# Patient Record
Sex: Female | Born: 1975 | Race: White | Hispanic: No | Smoking: Never smoker
Health system: Southern US, Community
[De-identification: ages and names within clinical notes are randomized; demographics above are authoritative.]

## PROBLEM LIST (undated history)

## (undated) DIAGNOSIS — F32A Depression, unspecified: Secondary | ICD-10-CM

## (undated) DIAGNOSIS — I728 Aneurysm of other specified arteries: Secondary | ICD-10-CM

## (undated) DIAGNOSIS — F329 Major depressive disorder, single episode, unspecified: Secondary | ICD-10-CM

## (undated) DIAGNOSIS — E079 Disorder of thyroid, unspecified: Secondary | ICD-10-CM

## (undated) DIAGNOSIS — I82409 Acute embolism and thrombosis of unspecified deep veins of unspecified lower extremity: Secondary | ICD-10-CM

## (undated) DIAGNOSIS — F419 Anxiety disorder, unspecified: Secondary | ICD-10-CM

## (undated) DIAGNOSIS — G43909 Migraine, unspecified, not intractable, without status migrainosus: Secondary | ICD-10-CM

## (undated) DIAGNOSIS — I2699 Other pulmonary embolism without acute cor pulmonale: Secondary | ICD-10-CM

## (undated) HISTORY — PX: TUBAL LIGATION: SHX77

## (undated) HISTORY — PX: TONSILLECTOMY: SUR1361

---

## 1998-05-23 ENCOUNTER — Other Ambulatory Visit: Admission: RE | Admit: 1998-05-23 | Discharge: 1998-05-23 | Payer: Self-pay | Admitting: Family Medicine

## 2007-06-07 ENCOUNTER — Encounter: Admission: RE | Admit: 2007-06-07 | Discharge: 2007-06-07 | Payer: Self-pay | Admitting: *Deleted

## 2009-03-12 ENCOUNTER — Encounter: Admission: RE | Admit: 2009-03-12 | Discharge: 2009-03-12 | Payer: Self-pay | Admitting: Family Medicine

## 2009-11-26 ENCOUNTER — Encounter: Admission: RE | Admit: 2009-11-26 | Discharge: 2009-11-26 | Payer: Self-pay | Admitting: Family Medicine

## 2013-06-07 ENCOUNTER — Encounter (HOSPITAL_BASED_OUTPATIENT_CLINIC_OR_DEPARTMENT_OTHER): Payer: Self-pay | Admitting: *Deleted

## 2013-06-07 ENCOUNTER — Emergency Department (HOSPITAL_BASED_OUTPATIENT_CLINIC_OR_DEPARTMENT_OTHER): Payer: 59

## 2013-06-07 ENCOUNTER — Emergency Department (HOSPITAL_BASED_OUTPATIENT_CLINIC_OR_DEPARTMENT_OTHER)
Admission: EM | Admit: 2013-06-07 | Discharge: 2013-06-08 | Disposition: A | Discharge status: Home / Self Care | Payer: 59 | Attending: Emergency Medicine | Admitting: Emergency Medicine

## 2013-06-07 DIAGNOSIS — M79605 Pain in left leg: Secondary | ICD-10-CM

## 2013-06-07 DIAGNOSIS — Z79899 Other long term (current) drug therapy: Secondary | ICD-10-CM | POA: Insufficient documentation

## 2013-06-07 DIAGNOSIS — M25569 Pain in unspecified knee: Secondary | ICD-10-CM | POA: Insufficient documentation

## 2013-06-07 DIAGNOSIS — Z86711 Personal history of pulmonary embolism: Secondary | ICD-10-CM | POA: Insufficient documentation

## 2013-06-07 DIAGNOSIS — E079 Disorder of thyroid, unspecified: Secondary | ICD-10-CM | POA: Insufficient documentation

## 2013-06-07 DIAGNOSIS — Z86718 Personal history of other venous thrombosis and embolism: Secondary | ICD-10-CM | POA: Insufficient documentation

## 2013-06-07 DIAGNOSIS — F3289 Other specified depressive episodes: Secondary | ICD-10-CM | POA: Insufficient documentation

## 2013-06-07 DIAGNOSIS — M25562 Pain in left knee: Secondary | ICD-10-CM

## 2013-06-07 DIAGNOSIS — F329 Major depressive disorder, single episode, unspecified: Secondary | ICD-10-CM | POA: Insufficient documentation

## 2013-06-07 HISTORY — DX: Acute embolism and thrombosis of unspecified deep veins of unspecified lower extremity: I82.409

## 2013-06-07 HISTORY — DX: Depression, unspecified: F32.A

## 2013-06-07 HISTORY — DX: Other pulmonary embolism without acute cor pulmonale: I26.99

## 2013-06-07 HISTORY — DX: Disorder of thyroid, unspecified: E07.9

## 2013-06-07 HISTORY — DX: Major depressive disorder, single episode, unspecified: F32.9

## 2013-06-07 LAB — CBC
HCT: 38.4 % (ref 36.0–46.0)
MCH: 30 pg (ref 26.0–34.0)
MCV: 88.1 fL (ref 78.0–100.0)
RBC: 4.36 MIL/uL (ref 3.87–5.11)
RDW: 12.8 % (ref 11.5–15.5)
WBC: 11.9 10*3/uL — ABNORMAL HIGH (ref 4.0–10.5)

## 2013-06-07 LAB — BASIC METABOLIC PANEL
Chloride: 105 mEq/L (ref 96–112)
GFR calc Af Amer: 84 mL/min — ABNORMAL LOW (ref >90)
Glucose, Bld: 120 mg/dL — ABNORMAL HIGH (ref 70–99)
Sodium: 142 mEq/L (ref 135–145)

## 2013-06-07 MED ORDER — MORPHINE SULFATE 4 MG/ML IJ SOLN
6.0000 mg | Freq: Once | INTRAMUSCULAR | Status: AC
  Administered 2013-06-07: 6 mg via INTRAVENOUS
  Filled 2013-06-07: qty 2

## 2013-06-07 MED ORDER — IOHEXOL 350 MG/ML SOLN
80.0000 mL | Freq: Once | INTRAVENOUS | Status: AC | PRN
  Administered 2013-06-07: 80 mL via INTRAVENOUS

## 2013-06-07 NOTE — ED Notes (Signed)
Vital signs stable. 

## 2013-06-07 NOTE — ED Provider Notes (Signed)
CSN: [id number]     Arrival date & time 06/07/13  2216 History     First MD Initiated Contact with Patient 06/07/13 2243     Chief Complaint  Patient presents with  . Leg Swelling    The history is provided by the patient.   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  She was on anticoagulation for a short time and is no longer on any anticoagulation.  At that time her DVT was blamed on the fact that she was on enzymes is estrogens.  She did not smoke cigarettes at this time.  Her grandmother has a history of blood clots as well.  There is no other family members with venothromboembolic disease.  No fevers or chills.  She reports there is a significant amount of pain in her left foot and that her left foot and swelling as well as she noticed first.  She denies recent fall or trauma to her left foot.  She denies numbness or weakness of her left foot.  No rash or redness noted.  No fevers or chills  Past Medical History  Diagnosis Date  . DVT (deep venous thrombosis)   . PE (pulmonary embolism)   . Thyroid disease   . Depression    Past Surgical History  Procedure Laterality Date  . Tubal ligation    . Tonsillectomy     History reviewed. No pertinent family history. History  Substance Use Topics  . Smoking status: Never Smoker   . Smokeless tobacco: Not on file  . Alcohol Use: No   OB History   Grav Para Term Preterm Abortions TAB SAB Ect Mult Living                 Review of Systems  All other systems reviewed and are negative.    Allergies  Sulfa antibiotics and Wellbutrin  Home Medications   Current Outpatient Rx  Name  Route  Sig  Dispense  Refill  . ALPRAZolam (XANAX) 0.25 MG tablet   Oral   Take 0.25 mg by mouth at bedtime as needed for sleep.         . cholecalciferol (VITAMIN D) 1000 UNITS tablet   Oral   Take 1,000 Units by mouth daily.         Marland Kitchen levothyroxine (SYNTHROID, LEVOTHROID) 200 MCG tablet   Oral   Take 200 mcg  by mouth daily before breakfast.         . ranitidine (ZANTAC) 150 MG tablet   Oral   Take 150 mg by mouth 2 (two) times daily.         . sertraline (ZOLOFT) 100 MG tablet   Oral   Take 100 mg by mouth daily.          BP 167/108  Pulse 89  Temp(Src) 98.8 F (37.1 C) (Oral)  Resp 18  Wt 280 lb (127.007 kg)  SpO2 100% Physical Exam  Nursing note and vitals reviewed. Constitutional: She is oriented to person, place, and time. She appears well-developed and well-nourished. No distress.  HENT:  Head: Normocephalic and atraumatic.  Eyes: EOM are normal.  Neck: Normal range of motion.  Cardiovascular: Normal rate, regular rhythm and normal heart sounds.   Pulmonary/Chest: Effort normal and breath sounds normal.  Abdominal: Soft. She exhibits no distension. There is no tenderness.  Musculoskeletal: Normal range of motion.  Swelling of her left foot with tenderness over the second and third as  well as fourth metatarsal of the dorsal surface of left foot.  Normal pulses in left foot.  Patient with tenderness of her left calf.  Mild tenderness over left medial thigh as well.  Neurological: She is alert and oriented to person, place, and time.  Skin: Skin is warm and dry.  Psychiatric: She has a normal mood and affect. Judgment normal.    ED Course   Procedures (including critical care time)  Labs Reviewed  CBC - Abnormal; Notable for the following:    WBC 11.9 (*)    All other components within normal limits  BASIC METABOLIC PANEL - Abnormal; Notable for the following:    Glucose, Bld 120 (*)    GFR calc non Af Amer 72 (*)    GFR calc Af Amer 84 (*)    All other components within normal limits    1. Pain in joint, lower leg, left   2. Leg pain, left     MDM  History DVT is important that the patient be evaluated for pulmonary embolism given her chest pain shortness of breath.  She's no longer on anticoagulants.  CT scan pending at this time.  Left foot does seem to  be tender but there is no trauma to this area.  X-rays will be obtained.  Patient will likely require outpatient imaging in the form of the left lower extremity duplex ultrasound to evaluate for DVT.  Brief bedside ultrasound that demonstrated compressibility of her proximal left deep venous system.  This is not an official study.  Her CT scan is negative she'll likely need be dose with Lovenox will need to be discharged home with instructions to followup tomorrow for duplex ultrasound of her left lower extremity at this facility.  Overall the patient looks okay.  Her vital signs are stable.  She has not tachycardic or hypoxic.  Lyanne Co, MD 06/08/13 [phone]

## 2013-06-07 NOTE — ED Notes (Signed)
MD at bedside. 

## 2013-06-07 NOTE — ED Notes (Signed)
Pt c/o left foot pain and swelling x 1 day  HX DVT

## 2013-06-07 NOTE — ED Notes (Signed)
Attempted IV x 2 in left arm unsuccessful. 

## 2013-06-08 ENCOUNTER — Ambulatory Visit (HOSPITAL_BASED_OUTPATIENT_CLINIC_OR_DEPARTMENT_OTHER)
Admit: 2013-06-08 | Discharge: 2013-06-08 | Disposition: A | Discharge status: Home / Self Care | Payer: 59 | Attending: Emergency Medicine | Admitting: Emergency Medicine

## 2013-06-08 DIAGNOSIS — M79605 Pain in left leg: Secondary | ICD-10-CM

## 2013-06-08 MED ORDER — HYDROCODONE-ACETAMINOPHEN 5-325 MG PO TABS: 1.0000 | ORAL_TABLET | ORAL | Status: DC | PRN

## 2013-06-08 MED ORDER — ENOXAPARIN SODIUM 150 MG/ML ~~LOC~~ SOLN
1.0000 mg/kg | Freq: Once | SUBCUTANEOUS | Status: AC
  Administered 2013-06-08: 130 mg via SUBCUTANEOUS
  Filled 2013-06-08: qty 1

## 2013-06-08 NOTE — ED Provider Notes (Signed)
Nursing notes and vitals signs, including pulse oximetry, reviewed.  Summary of this visit's results, reviewed by myself:  Labs:  Results for orders placed during the hospital encounter of 06/07/13 (from the past 24 hour(s))  CBC     Status: Abnormal   Collection Time    06/07/13 11:13 PM      Result Value Range   WBC 11.9 (*) 4.0 - 10.5 K/uL   RBC 4.36  3.87 - 5.11 MIL/uL   Hemoglobin 13.1  12.0 - 15.0 g/dL   HCT 21.3  08.6 - 57.8 %   MCV 88.1  78.0 - 100.0 fL   MCH 30.0  26.0 - 34.0 pg   MCHC 34.1  30.0 - 36.0 g/dL   RDW 46.9  62.9 - 52.8 %   Platelets 258  150 - 400 K/uL  BASIC METABOLIC PANEL     Status: Abnormal   Collection Time    06/07/13 11:13 PM      Result Value Range   Sodium 142  135 - 145 mEq/L   Potassium 4.6  3.5 - 5.1 mEq/L   Chloride 105  96 - 112 mEq/L   CO2 27  19 - 32 mEq/L   Glucose, Bld 120 (*) 70 - 99 mg/dL   BUN 14  6 - 23 mg/dL   Creatinine, Ser 4.13  0.50 - 1.10 mg/dL   Calcium 9.6  8.4 - 24.4 mg/dL   GFR calc non Af Amer 72 (*) >90 mL/min   GFR calc Af Amer 84 (*) >90 mL/min    Imaging Studies: Ct Angio Chest W/cm &/or Wo Cm  06/08/2013   *RADIOLOGY REPORT*  Clinical Data: , chest pain, shortness of breath, history of DVT, evaluate for pulmonary embolus  CT ANGIOGRAPHY CHEST  Technique:  Multidetector CT imaging of the chest using the standard protocol during bolus administration of intravenous contrast. Multiplanar reconstructed images including MIPs were obtained and reviewed to evaluate the vascular anatomy.  Contrast: 80mL OMNIPAQUE IOHEXOL 350 MG/ML SOLN  Comparison: CT from 06/07/2007.  Findings: There are no pathologically enlarged mediastinal, hilar, or axillary lymph nodes.  The aorta is normal without evidence of dissection or other injury. Great vessels are normal.  The heart demonstrates a normal contrast enhanced appearance would there is no pericardial effusion.  The pulmonary arteries are well opacified with no filling defects identified  to suggest acute pulmonary embolus.  Reformatted imaging confirm these findings.  The lungs are clear without pulmonary edema or airspace consolidation.  There is no pleural effusion.  No pneumothorax. Subsegmental atelectasis is present within the lower lobes dependently.  The visualized portions of the upper abdomen are unremarkable.  No acute osseous abnormality.  IMPRESSION: Negative CT with no evidence of acute pulmonary embolus.   Original Report Authenticated By: Rise Mu, M.D.   Dg Foot Complete Left  06/08/2013   *RADIOLOGY REPORT*  Clinical Data: Left foot pain and swelling.  LEFT FOOT - COMPLETE 3+ VIEW  Comparison: None.  Findings: Imaged bones, joints and soft tissues appear normal.  IMPRESSION: Negative exam.   Original Report Authenticated By: Holley Dexter, M.D.      Hanley Seamen, MD 06/08/13 [phone]

## 2013-06-08 NOTE — ED Notes (Signed)
Pt will not rate pain but states "its OK"

## 2016-07-18 ENCOUNTER — Ambulatory Visit (INDEPENDENT_AMBULATORY_CARE_PROVIDER_SITE_OTHER): Payer: BLUE CROSS/BLUE SHIELD | Admitting: Psychology

## 2016-07-18 DIAGNOSIS — F330 Major depressive disorder, recurrent, mild: Secondary | ICD-10-CM | POA: Diagnosis not present

## 2016-08-15 ENCOUNTER — Ambulatory Visit (INDEPENDENT_AMBULATORY_CARE_PROVIDER_SITE_OTHER): Payer: BLUE CROSS/BLUE SHIELD | Admitting: Psychology

## 2016-08-15 DIAGNOSIS — F330 Major depressive disorder, recurrent, mild: Secondary | ICD-10-CM | POA: Diagnosis not present

## 2016-09-12 ENCOUNTER — Ambulatory Visit (INDEPENDENT_AMBULATORY_CARE_PROVIDER_SITE_OTHER): Payer: BLUE CROSS/BLUE SHIELD | Admitting: Psychology

## 2016-09-12 DIAGNOSIS — F330 Major depressive disorder, recurrent, mild: Secondary | ICD-10-CM | POA: Diagnosis not present

## 2016-09-24 ENCOUNTER — Ambulatory Visit: Payer: Self-pay | Admitting: Psychology

## 2016-10-24 ENCOUNTER — Ambulatory Visit: Payer: Self-pay | Admitting: Psychology

## 2017-11-19 ENCOUNTER — Encounter (HOSPITAL_BASED_OUTPATIENT_CLINIC_OR_DEPARTMENT_OTHER): Payer: Self-pay

## 2017-11-19 ENCOUNTER — Other Ambulatory Visit: Payer: Self-pay

## 2017-11-19 ENCOUNTER — Emergency Department (HOSPITAL_BASED_OUTPATIENT_CLINIC_OR_DEPARTMENT_OTHER)
Admission: EM | Admit: 2017-11-19 | Discharge: 2017-11-19 | Disposition: A | Discharge status: Home / Self Care | Payer: Medicaid Other | Attending: Emergency Medicine | Admitting: Emergency Medicine

## 2017-11-19 DIAGNOSIS — Z79899 Other long term (current) drug therapy: Secondary | ICD-10-CM | POA: Insufficient documentation

## 2017-11-19 DIAGNOSIS — F419 Anxiety disorder, unspecified: Secondary | ICD-10-CM | POA: Insufficient documentation

## 2017-11-19 DIAGNOSIS — R519 Headache, unspecified: Secondary | ICD-10-CM

## 2017-11-19 DIAGNOSIS — G43909 Migraine, unspecified, not intractable, without status migrainosus: Secondary | ICD-10-CM | POA: Diagnosis present

## 2017-11-19 DIAGNOSIS — E079 Disorder of thyroid, unspecified: Secondary | ICD-10-CM | POA: Diagnosis not present

## 2017-11-19 DIAGNOSIS — R51 Headache: Secondary | ICD-10-CM | POA: Insufficient documentation

## 2017-11-19 HISTORY — DX: Migraine, unspecified, not intractable, without status migrainosus: G43.909

## 2017-11-19 HISTORY — DX: Aneurysm of other specified arteries: I72.8

## 2017-11-19 HISTORY — DX: Anxiety disorder, unspecified: F41.9

## 2017-11-19 MED ORDER — KETOROLAC TROMETHAMINE 15 MG/ML IJ SOLN
15.0000 mg | Freq: Once | INTRAMUSCULAR | Status: AC
  Administered 2017-11-19: 15 mg via INTRAVENOUS
  Filled 2017-11-19: qty 1

## 2017-11-19 MED ORDER — SODIUM CHLORIDE 0.9 % IV BOLUS (SEPSIS)
500.0000 mL | Freq: Once | INTRAVENOUS | Status: AC
  Administered 2017-11-19: 500 mL via INTRAVENOUS

## 2017-11-19 MED ORDER — DEXAMETHASONE 6 MG PO TABS
10.0000 mg | ORAL_TABLET | Freq: Once | ORAL | Status: AC
  Administered 2017-11-19: 10 mg via ORAL
  Filled 2017-11-19: qty 1

## 2017-11-19 MED ORDER — DIPHENHYDRAMINE HCL 50 MG/ML IJ SOLN
25.0000 mg | Freq: Once | INTRAMUSCULAR | Status: AC
  Administered 2017-11-19: 25 mg via INTRAVENOUS
  Filled 2017-11-19: qty 1

## 2017-11-19 MED ORDER — METOCLOPRAMIDE HCL 5 MG/ML IJ SOLN
10.0000 mg | Freq: Once | INTRAMUSCULAR | Status: AC
  Administered 2017-11-19: 10 mg via INTRAVENOUS
  Filled 2017-11-19: qty 2

## 2017-11-19 NOTE — ED Notes (Signed)
ED Provider at bedside. 

## 2017-11-19 NOTE — ED Triage Notes (Signed)
C/o migraine x 2 days-NAD-wearing sunglasses -steady gait

## 2017-11-19 NOTE — ED Provider Notes (Signed)
[facility] EMERGENCY DEPARTMENT Provider Note   CSN: [id number] Arrival date & time: 11/19/17  1147     History   Chief Complaint Chief Complaint  Patient presents with  . Migraine    HPI Cindy Wilkerson is a 42 y.o. female.  The history is provided by the patient. No language interpreter was used.  Migraine     Cindy Wilkerson is a 42 y.o. female who presents to the Emergency Department complaining of migraine.  She reports migraine headache that started 3 days ago.  The headache is located in the occipital scalp and is described as throbbing in nature.  It is waxing and waning.  She has taken Imitrex at home with partial improvement but no complete resolution of her headache.  She has associated photophobia.  No fevers, vomiting, diarrhea, abdominal pain, numbness.  She does feel shaky at times.  Symptoms are similar to her prior migraine headaches.  She does have a history of DVT and PE as well as splenic artery aneurysm.  She is not currently on any anticoagulants.  Past Medical History:  Diagnosis Date  . Anxiety   . Depression   . DVT (deep venous thrombosis) (HCC)   . Migraine   . PE (pulmonary embolism)   . Splenic artery aneurysm (HCC)   . Thyroid disease     There are no active problems to display for this patient.   Past Surgical History:  Procedure Laterality Date  . TONSILLECTOMY    . TUBAL LIGATION      OB History    No data available       Home Medications    Prior to Admission medications   Medication Sig Start Date End Date Taking? Authorizing Provider  ARIPiprazole (ABILIFY DISCMELT PO) Take by mouth.   Yes [provider]  DULoxetine HCl (CYMBALTA PO) Take by mouth.   Yes [provider]  Topiramate (TOPAMAX PO) Take by mouth.   Yes [provider]  levothyroxine (SYNTHROID, LEVOTHROID) 200 MCG tablet Take 200 mcg by mouth daily before breakfast.    [provider]  ranitidine (ZANTAC) 150 MG  tablet Take 150 mg by mouth 2 (two) times daily.    [provider]    Family History No family history on file.  Social History Social History   Tobacco Use  . Smoking status: Never Smoker  . Smokeless tobacco: Never Used  Substance Use Topics  . Alcohol use: Yes    Comment: occ  . Drug use: No     Allergies   Amoxil [amoxicillin]; Ciprofloxacin; Sulfa antibiotics; and Wellbutrin [bupropion]   Review of Systems Review of Systems  All other systems reviewed and are negative.    Physical Exam Updated Vital Signs BP 98/64 (BP Location: Right Arm)   Pulse 93   Temp 97.7 F (36.5 C) (Oral)   Resp 16   Ht 5\' 5"  (1.651 m)   Wt 122.9 kg (271 lb)   SpO2 99%   BMI 45.10 kg/m   Physical Exam  Constitutional: She is oriented to person, place, and time. She appears well-developed and well-nourished.  HENT:  Head: Normocephalic and atraumatic.  Eyes: EOM are normal. Pupils are equal, round, and reactive to light.  Mild photophobia  Cardiovascular: Normal rate and regular rhythm.  No murmur heard. Pulmonary/Chest: Effort normal and breath sounds normal. No respiratory distress.  Abdominal: Soft. There is no tenderness. There is no rebound and no guarding.  Musculoskeletal: She exhibits  no edema or tenderness.  Neurological: She is alert and oriented to person, place, and time. No cranial nerve deficit.  5 out of 5 strength in all 4 extremities with sensation light touch intact in all 4 extremities  Skin: Skin is warm and dry.  Psychiatric: She has a normal mood and affect. Her behavior is normal.  Nursing note and vitals reviewed.    ED Treatments / Results  Labs (all labs ordered are listed, but only abnormal results are displayed) Labs Reviewed - No data to display  EKG  EKG Interpretation None       Radiology No results found.  Procedures Procedures (including critical care time)  Medications Ordered in ED Medications  dexamethasone  (DECADRON) tablet 10 mg (not administered)  metoCLOPramide (REGLAN) injection 10 mg (10 mg Intravenous Given 11/19/17 1452)  diphenhydrAMINE (BENADRYL) injection 25 mg (25 mg Intravenous Given 11/19/17 1448)  ketorolac (TORADOL) 15 MG/ML injection 15 mg (15 mg Intravenous Given 11/19/17 1450)  sodium chloride 0.9 % bolus 500 mL (500 mLs Intravenous New Bag/Given 11/19/17 1447)     Initial Impression / Assessment and Plan / ED Course  I have reviewed the triage vital signs and the nursing notes.  Pertinent labs & imaging results that were available during my care of the patient were reviewed by me and considered in my medical decision making (see chart for details).     Pt with hx/o migraine headache her with headache similar to prior migraines.  She is nontoxic appearing on exam with no focal neurologic deficits. Presentation is not c/w meningitis, SAH, dural sinus thrombosis.  Headache is resolved following treatment in the ED.  Counseled patient on home care, outpatient follow up and return precautions.    Final Clinical Impressions(s) / ED Diagnoses   Final diagnoses:  Bad headache    ED Discharge Orders    None       Tilden Fossaees, Chevon Fomby, MD 11/19/17 1540

## 2017-12-14 ENCOUNTER — Ambulatory Visit (INDEPENDENT_AMBULATORY_CARE_PROVIDER_SITE_OTHER): Payer: Medicaid Other | Admitting: Psychology

## 2017-12-14 DIAGNOSIS — F330 Major depressive disorder, recurrent, mild: Secondary | ICD-10-CM

## 2017-12-15 ENCOUNTER — Ambulatory Visit (INDEPENDENT_AMBULATORY_CARE_PROVIDER_SITE_OTHER): Payer: Medicaid Other

## 2017-12-15 ENCOUNTER — Encounter (HOSPITAL_COMMUNITY): Payer: Self-pay | Admitting: Emergency Medicine

## 2017-12-15 ENCOUNTER — Ambulatory Visit (HOSPITAL_COMMUNITY)
Admission: EM | Admit: 2017-12-15 | Discharge: 2017-12-15 | Disposition: A | Discharge status: Home / Self Care | Payer: Medicaid Other | Attending: Family Medicine | Admitting: Family Medicine

## 2017-12-15 DIAGNOSIS — S46912A Strain of unspecified muscle, fascia and tendon at shoulder and upper arm level, left arm, initial encounter: Secondary | ICD-10-CM

## 2017-12-15 DIAGNOSIS — S5012XA Contusion of left forearm, initial encounter: Secondary | ICD-10-CM

## 2017-12-15 DIAGNOSIS — S96911A Strain of unspecified muscle and tendon at ankle and foot level, right foot, initial encounter: Secondary | ICD-10-CM | POA: Diagnosis not present

## 2017-12-15 MED ORDER — CYCLOBENZAPRINE HCL 10 MG PO TABS: ORAL_TABLET | ORAL | 0 refills | Status: AC

## 2017-12-15 MED ORDER — NAPROXEN 500 MG PO TABS: 500.0000 mg | ORAL_TABLET | Freq: Two times a day (BID) | ORAL | 0 refills | Status: AC | PRN
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
## 2017-12-15 NOTE — Discharge Instructions (Addendum)
Recommend start Naproxen 500mg  twice a day as directed. May take Flexeril 10mg  - 1/2 to 1 tablet at night and up to twice a day as needed for muscle pain/spasms. Apply warm compresses to shoulder for comfort and ice packs to right ankle for comfort for the next 24 to 48 hours. Wear ace wrap on ankle for support. Follow-up with your PCP in 3 to 4 days if not improving.
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
## 2017-12-25 ENCOUNTER — Ambulatory Visit: Payer: Medicaid Other | Admitting: Psychology

## 2018-01-01 ENCOUNTER — Ambulatory Visit (INDEPENDENT_AMBULATORY_CARE_PROVIDER_SITE_OTHER): Payer: Medicaid Other | Admitting: Psychology

## 2018-01-01 ENCOUNTER — Ambulatory Visit: Payer: Medicaid Other | Admitting: Psychology

## 2018-01-01 DIAGNOSIS — F330 Major depressive disorder, recurrent, mild: Secondary | ICD-10-CM

## 2018-01-12 ENCOUNTER — Ambulatory Visit (INDEPENDENT_AMBULATORY_CARE_PROVIDER_SITE_OTHER): Payer: Medicaid Other | Admitting: Psychology

## 2018-01-12 DIAGNOSIS — F330 Major depressive disorder, recurrent, mild: Secondary | ICD-10-CM

## 2018-01-25 ENCOUNTER — Ambulatory Visit (INDEPENDENT_AMBULATORY_CARE_PROVIDER_SITE_OTHER): Payer: Medicaid Other | Admitting: Psychology

## 2018-01-25 ENCOUNTER — Ambulatory Visit: Payer: Medicaid Other | Admitting: Psychology

## 2018-01-25 DIAGNOSIS — F330 Major depressive disorder, recurrent, mild: Secondary | ICD-10-CM

## 2018-02-08 ENCOUNTER — Ambulatory Visit (INDEPENDENT_AMBULATORY_CARE_PROVIDER_SITE_OTHER): Payer: Medicaid Other | Admitting: Psychology

## 2018-02-08 DIAGNOSIS — F330 Major depressive disorder, recurrent, mild: Secondary | ICD-10-CM

## 2018-02-22 ENCOUNTER — Ambulatory Visit (INDEPENDENT_AMBULATORY_CARE_PROVIDER_SITE_OTHER): Payer: Medicaid Other | Admitting: Psychology

## 2018-02-22 DIAGNOSIS — F331 Major depressive disorder, recurrent, moderate: Secondary | ICD-10-CM

## 2018-03-09 ENCOUNTER — Ambulatory Visit: Payer: Medicaid Other | Admitting: Psychology

## 2018-03-22 ENCOUNTER — Ambulatory Visit (INDEPENDENT_AMBULATORY_CARE_PROVIDER_SITE_OTHER): Payer: Medicaid Other | Admitting: Psychology

## 2018-03-22 DIAGNOSIS — F330 Major depressive disorder, recurrent, mild: Secondary | ICD-10-CM

## 2018-04-05 ENCOUNTER — Ambulatory Visit: Payer: Medicaid Other | Admitting: Psychology

## 2018-04-07 ENCOUNTER — Ambulatory Visit: Payer: Medicaid Other | Admitting: Psychology

## 2018-04-21 ENCOUNTER — Ambulatory Visit: Payer: Medicaid Other | Admitting: Psychology

## 2018-05-05 ENCOUNTER — Ambulatory Visit (INDEPENDENT_AMBULATORY_CARE_PROVIDER_SITE_OTHER): Payer: Self-pay | Admitting: Psychology

## 2018-05-05 DIAGNOSIS — F330 Major depressive disorder, recurrent, mild: Secondary | ICD-10-CM

## 2018-05-19 ENCOUNTER — Ambulatory Visit: Payer: Medicaid Other | Admitting: Psychology

## 2018-06-02 ENCOUNTER — Ambulatory Visit: Payer: 59 | Admitting: Psychology

## 2018-06-23 ENCOUNTER — Ambulatory Visit (INDEPENDENT_AMBULATORY_CARE_PROVIDER_SITE_OTHER): Payer: 59 | Admitting: Psychology

## 2018-06-23 DIAGNOSIS — F330 Major depressive disorder, recurrent, mild: Secondary | ICD-10-CM | POA: Diagnosis not present

## 2018-07-07 ENCOUNTER — Ambulatory Visit: Payer: 59 | Admitting: Psychology

## 2018-07-08 ENCOUNTER — Ambulatory Visit (INDEPENDENT_AMBULATORY_CARE_PROVIDER_SITE_OTHER): Payer: 59 | Admitting: Psychology

## 2018-07-08 DIAGNOSIS — F330 Major depressive disorder, recurrent, mild: Secondary | ICD-10-CM | POA: Diagnosis not present

## 2018-07-21 ENCOUNTER — Ambulatory Visit (INDEPENDENT_AMBULATORY_CARE_PROVIDER_SITE_OTHER): Payer: 59 | Admitting: Psychology

## 2018-07-21 DIAGNOSIS — F4323 Adjustment disorder with mixed anxiety and depressed mood: Secondary | ICD-10-CM

## 2018-07-21 DIAGNOSIS — F330 Major depressive disorder, recurrent, mild: Secondary | ICD-10-CM | POA: Diagnosis not present

## 2018-08-10 ENCOUNTER — Ambulatory Visit: Payer: 59 | Admitting: Psychology

## 2018-08-18 ENCOUNTER — Emergency Department (HOSPITAL_COMMUNITY): Payer: Self-pay

## 2018-08-18 ENCOUNTER — Encounter (HOSPITAL_COMMUNITY): Payer: Self-pay | Admitting: Emergency Medicine

## 2018-08-18 ENCOUNTER — Emergency Department (HOSPITAL_COMMUNITY)
Admission: EM | Admit: 2018-08-18 | Discharge: 2018-08-18 | Disposition: A | Discharge status: Home / Self Care | Payer: Self-pay | Attending: Emergency Medicine | Admitting: Emergency Medicine

## 2018-08-18 DIAGNOSIS — B9689 Other specified bacterial agents as the cause of diseases classified elsewhere: Secondary | ICD-10-CM | POA: Insufficient documentation

## 2018-08-18 DIAGNOSIS — R109 Unspecified abdominal pain: Secondary | ICD-10-CM

## 2018-08-18 DIAGNOSIS — Z79899 Other long term (current) drug therapy: Secondary | ICD-10-CM | POA: Insufficient documentation

## 2018-08-18 DIAGNOSIS — N760 Acute vaginitis: Secondary | ICD-10-CM | POA: Insufficient documentation

## 2018-08-18 LAB — I-STAT BETA HCG BLOOD, ED (MC, WL, AP ONLY): I-stat hCG, quantitative: <5 m[IU]/mL (ref <5)

## 2018-08-18 LAB — COMPREHENSIVE METABOLIC PANEL
ALT: 18 U/L (ref 0–44)
AST: 18 U/L (ref 15–41)
Albumin: 4.1 g/dL (ref 3.5–5.0)
Alkaline Phosphatase: 57 U/L (ref 38–126)
Anion gap: 7 (ref 5–15)
BUN: 12 mg/dL (ref 6–20)
CO2: 23 mmol/L (ref 22–32)
Calcium: 9.1 mg/dL (ref 8.9–10.3)
Chloride: 112 mmol/L — ABNORMAL HIGH (ref 98–111)
Creatinine, Ser: 0.96 mg/dL (ref 0.44–1.00)
GFR calc Af Amer: >60 mL/min (ref >60)
GFR calc non Af Amer: >60 mL/min (ref >60)
Glucose, Bld: 83 mg/dL (ref 70–99)
Potassium: 3.9 mmol/L (ref 3.5–5.1)
Sodium: 142 mmol/L (ref 135–145)
Total Bilirubin: 0.8 mg/dL (ref 0.3–1.2)
Total Protein: 6.9 g/dL (ref 6.5–8.1)

## 2018-08-18 LAB — CBC WITH DIFFERENTIAL/PLATELET
Abs Immature Granulocytes: 0.05 10*3/uL (ref 0.00–0.07)
Basophils Absolute: 0.1 10*3/uL (ref 0.0–0.1)
Basophils Relative: 1 %
Eosinophils Absolute: 0.6 10*3/uL — ABNORMAL HIGH (ref 0.0–0.5)
Eosinophils Relative: 5 %
HCT: 41.2 % (ref 36.0–46.0)
Hemoglobin: 13.2 g/dL (ref 12.0–15.0)
Immature Granulocytes: 0 %
Lymphocytes Relative: 29 %
Lymphs Abs: 3.3 10*3/uL (ref 0.7–4.0)
MCH: 30.1 pg (ref 26.0–34.0)
MCHC: 32 g/dL (ref 30.0–36.0)
MCV: 94.1 fL (ref 80.0–100.0)
Monocytes Absolute: 1 10*3/uL (ref 0.1–1.0)
Monocytes Relative: 8 %
Neutro Abs: 6.5 10*3/uL (ref 1.7–7.7)
Neutrophils Relative %: 57 %
Platelets: 264 10*3/uL (ref 150–400)
RBC: 4.38 MIL/uL (ref 3.87–5.11)
RDW: 13.2 % (ref 11.5–15.5)
WBC: 11.4 10*3/uL — ABNORMAL HIGH (ref 4.0–10.5)
nRBC: 0 % (ref 0.0–0.2)

## 2018-08-18 LAB — WET PREP, GENITAL
Sperm: NONE SEEN
Trich, Wet Prep: NONE SEEN
Yeast Wet Prep HPF POC: NONE SEEN

## 2018-08-18 LAB — URINALYSIS, ROUTINE W REFLEX MICROSCOPIC
Bacteria, UA: NONE SEEN
Bilirubin Urine: NEGATIVE
Glucose, UA: NEGATIVE mg/dL
Ketones, ur: NEGATIVE mg/dL
Nitrite: NEGATIVE
Protein, ur: NEGATIVE mg/dL
Specific Gravity, Urine: 1.003 — ABNORMAL LOW (ref 1.005–1.030)
pH: 7 (ref 5.0–8.0)

## 2018-08-18 LAB — LIPASE, BLOOD: Lipase: 43 U/L (ref 11–51)

## 2018-08-18 MED ORDER — SODIUM CHLORIDE 0.9 % IJ SOLN
INTRAMUSCULAR | Status: AC
  Filled 2018-08-18: qty 50

## 2018-08-18 MED ORDER — IOPAMIDOL (ISOVUE-300) INJECTION 61%
INTRAVENOUS | Status: AC
  Filled 2018-08-18: qty 100

## 2018-08-18 MED ORDER — ONDANSETRON HCL 4 MG/2ML IJ SOLN
4.0000 mg | Freq: Once | INTRAMUSCULAR | Status: AC
  Administered 2018-08-18: 4 mg via INTRAVENOUS
  Filled 2018-08-18: qty 2

## 2018-08-18 MED ORDER — MORPHINE SULFATE (PF) 4 MG/ML IV SOLN
4.0000 mg | Freq: Once | INTRAVENOUS | Status: AC
  Administered 2018-08-18: 4 mg via INTRAVENOUS
  Filled 2018-08-18: qty 1

## 2018-08-18 MED ORDER — ONDANSETRON 4 MG PO TBDP: 4.0000 mg | ORAL_TABLET | Freq: Three times a day (TID) | ORAL | 0 refills | Status: AC | PRN

## 2018-08-18 MED ORDER — IOPAMIDOL (ISOVUE-300) INJECTION 61%
100.0000 mL | Freq: Once | INTRAVENOUS | Status: AC | PRN
  Administered 2018-08-18: 100 mL via INTRAVENOUS

## 2018-08-18 MED ORDER — METRONIDAZOLE 500 MG PO TABS: 500.0000 mg | ORAL_TABLET | Freq: Two times a day (BID) | ORAL | 0 refills | Status: AC

## 2018-08-18 NOTE — Discharge Instructions (Signed)
1. Medications: Please take all of your antibiotics until finished!   You may develop abdominal discomfort or diarrhea from the antibiotic.  You may help offset this with probiotics which you can buy or get in yogurt. Do not eat  or take the probiotics until 2 hours after your antibiotic.  Do not drink alcohol with antibiotics.  Alternate 600 mg of ibuprofen and [phone] mg of Tylenol every 3 hours as needed for pain. Do not exceed 4000 mg of Tylenol daily.  Take ibuprofen with food to avoid upset stomach.  Take Zofran as needed for nausea.  Wait around 20 minutes before eating or drinking after taking this medication. 2. Treatment: rest, drink plenty of fluids, advance diet slowly.  Eat a diet of bland foods that will not upset your stomach such as crackers, mashed potatoes, and peanut butter. 3. Follow Up: Please followup with your primary doctor or general surgeon in 3 days for discussion of your diagnoses and further evaluation after today's visit; if you do not have a primary care doctor use the resource guide provided to find one; Please return to the ER for persistent vomiting, high fevers or worsening symptoms

## 2018-08-18 NOTE — ED Notes (Signed)
Bed: WA09 Expected date:  Expected time:  Means of arrival:  Comments: 41s f abd pain

## 2018-08-18 NOTE — ED Triage Notes (Signed)
Patient has abdominal pain in the left lower quadrant for a day. She reports experiencing some nausea.

## 2018-08-18 NOTE — ED Provider Notes (Signed)
[facility]-EMERGENCY DEPT Provider Note   CSN: [id number] Arrival date & time: 08/18/18  1259     History   Chief Complaint Chief Complaint  Patient presents with  . Abdominal Pain    HPI Cindy Wilkerson is a 42 y.o. female with history of migraines, PE, depression, anxiety, spinal artery aneurysm, perforated diverticulum status post partial colectomy on 06/12/2018 at an outside hospital presents for evaluation of acute onset, progressively worsening left-sided abdominal pain since yesterday.  Pain is constant, intermittently throbbing and at times sharp.  Worsens with bending and palpation.  Also notes worsening pain in the abdomen with urination.  Denies dysuria, hematuria, urgency, or frequency.  Has some nausea but no vomiting. No fevers or chills.  She does note that stool output in her ostomy bag it is looser than usual and her stoma site bleeds sometimes but this is not unusual.  Denies fevers or chills.  Went to urgent care and was sent here for further evaluation.  Her general surgeon is with [facility].  No recent treatment with antibiotics.  The history is provided by the patient.    Past Medical History:  Diagnosis Date  . Anxiety   . Depression   . DVT (deep venous thrombosis) (HCC)   . Migraine   . PE (pulmonary embolism)   . Splenic artery aneurysm (HCC)   . Thyroid disease     There are no active problems to display for this patient.   Past Surgical History:  Procedure Laterality Date  . TONSILLECTOMY    . TUBAL LIGATION       OB History   None      Home Medications    Prior to Admission medications   Medication Sig Start Date End Date Taking? Authorizing Provider  albuterol (ACCUNEB) 1.25 MG/3ML nebulizer solution Inhale 3 mLs into the lungs 3 (three) times daily as needed for wheezing. 11/02/17  Yes [provider]  albuterol (PROVENTIL HFA;VENTOLIN HFA) 108 (90 Base) MCG/ACT inhaler Inhale 1-2 puffs into the lungs daily  as needed for wheezing. 02/19/17  Yes [provider]  ARIPiprazole (ABILIFY DISCMELT PO) Take 5 mg by mouth daily.    Yes [provider]  CLEOCIN 100 MG vaginal suppository Place 1 suppository vaginally once a week. 08/05/18  Yes [provider]  cyclobenzaprine (FLEXERIL) 5 MG tablet Take 5 mg by mouth daily as needed for pain. 08/09/18  Yes [provider]  DULoxetine (CYMBALTA) 60 MG capsule Take 120 mg by mouth daily.    Yes [provider]  fluconazole (DIFLUCAN) 200 MG tablet Take 200 mg by mouth.  08/11/18 08/18/18 Yes [provider]  levothyroxine (SYNTHROID, LEVOTHROID) 200 MCG tablet Take 175 mcg by mouth daily before breakfast.    Yes [provider]  lisinopril (PRINIVIL,ZESTRIL) 10 MG tablet Take 10 mg by mouth daily.   Yes [provider]  LORazepam (ATIVAN) 0.5 MG tablet Take 0.5 mg by mouth 2 (two) times daily as needed for anxiety. 08/01/15  Yes [provider]  Multiple Vitamin (MULTIVITAMIN) tablet Take 1 tablet by mouth daily.   Yes [provider]  nystatin ointment (MYCOSTATIN) Apply 1 application topically at bedtime. 08/11/18  Yes [provider]  omeprazole (PRILOSEC) 20 MG capsule Take 20 mg by mouth daily.   Yes [provider]  SUMAtriptan (IMITREX) 100 MG tablet Take 100 mg by mouth daily as needed for migraine. 06/05/15  Yes [provider]  topiramate (TOPAMAX) 100  MG tablet Take 200 mg by mouth 2 (two) times daily.    Yes [provider]  cyclobenzaprine (FLEXERIL) 10 MG tablet May take 1/2 tablet to 1 whole tablet by mouth at night and up to 2 times a day as needed for muscle pain/spasms. Patient not taking: Reported on 08/18/2018 12/15/17   Sudie Grumbling, NP  metroNIDAZOLE (FLAGYL) 500 MG tablet Take 1 tablet (500 mg total) by mouth 2 (two) times daily. 08/18/18   Antigone Crowell A, PA-C  naproxen (NAPROSYN) 500 MG tablet Take 1 tablet (500 mg  total) by mouth 2 (two) times daily as needed for moderate pain. Patient not taking: Reported on 08/18/2018 12/15/17   Sudie Grumbling, NP  ondansetron (ZOFRAN ODT) 4 MG disintegrating tablet Take 1 tablet (4 mg total) by mouth every 8 (eight) hours as needed for nausea or vomiting. 08/18/18   Maurisio Ruddy A, PA-C  Probiotic Product (PROBIOTIC ADVANCED) CAPS Take 1 capsule by mouth daily.    [provider]    Family History History reviewed. No pertinent family history.  Social History Social History   Tobacco Use  . Smoking status: Never Smoker  . Smokeless tobacco: Never Used  Substance Use Topics  . Alcohol use: Yes    Comment: occ  . Drug use: No     Allergies   Amoxil [amoxicillin]; Ciprofloxacin; Sulfa antibiotics; and Wellbutrin [bupropion]   Review of Systems Review of Systems  Constitutional: Negative for chills and fever.  Respiratory: Negative for shortness of breath.   Cardiovascular: Negative for chest pain.  Gastrointestinal: Positive for abdominal pain, diarrhea and nausea. Negative for vomiting.  Genitourinary: Negative for dysuria, frequency, hematuria and urgency.  All other systems reviewed and are negative.    Physical Exam Updated Vital Signs BP 122/84 (BP Location: Left Arm) Comment: Simultaneous filing. User may not have seen previous data.  Pulse 85   Temp 97.7 F (36.5 C) (Oral)   Resp 16   SpO2 100%   Physical Exam  Constitutional: She appears well-developed and well-nourished. No distress.  HENT:  Head: Normocephalic and atraumatic.  Eyes: Conjunctivae are normal. Right eye exhibits no discharge. Left eye exhibits no discharge.  Neck: No JVD present. No tracheal deviation present.  Cardiovascular: Normal rate, regular rhythm and normal heart sounds.  Pulmonary/Chest: Effort normal and breath sounds normal.  Abdominal: She exhibits no distension. There is tenderness in the left upper quadrant and left lower quadrant.  See below  image.  Patient with left-sided ostomy with loose stools, no frank bleeding noted.  No tenderness to palpation around the stoma site. Midline surgical incision appears well-healing with beefy red granulation tissue, no surrounding erythema or induration.  No abnormal drainage.  Genitourinary:  Genitourinary Comments: Examination performed in the presence of a chaperone.  No masses or lesions to the external genitalia.  Moderate amount of yellow-clear mucus-like discharge in the vaginal vault.  No cervical motion tenderness or adnexal tenderness.  Cervix is not friable in appearance.  IUD strings are clearly visible from the cervix  Musculoskeletal: She exhibits no edema.  Neurological: She is alert.  Skin: Skin is warm and dry. No erythema.  Psychiatric: She has a normal mood and affect. Her behavior is normal.  Nursing note and vitals reviewed.      ED Treatments / Results  Labs (all labs ordered are listed, but only abnormal results are displayed) Labs Reviewed  WET PREP, GENITAL - Abnormal; Notable for the following components:  Result Value   Clue Cells Wet Prep HPF POC PRESENT (*)    WBC, Wet Prep HPF POC FEW (*)    All other components within normal limits  CBC WITH DIFFERENTIAL/PLATELET - Abnormal; Notable for the following components:   WBC 11.4 (*)    Eosinophils Absolute 0.6 (*)    All other components within normal limits  COMPREHENSIVE METABOLIC PANEL - Abnormal; Notable for the following components:   Chloride 112 (*)    All other components within normal limits  URINALYSIS, ROUTINE W REFLEX MICROSCOPIC - Abnormal; Notable for the following components:   Color, Urine STRAW (*)    Specific Gravity, Urine 1.003 (*)    Hgb urine dipstick SMALL (*)    Leukocytes, UA SMALL (*)    All other components within normal limits  LIPASE, BLOOD  I-STAT BETA HCG BLOOD, ED ([facility], [facility], [facility] ONLY)  GC/CHLAMYDIA PROBE AMP ([place]) NOT AT [facility]    EKG None  Radiology US  Transvaginal Non-ob  Result Date: 08/18/2018 CLINICAL DATA:  Initial evaluation for acute left lower quadrant abdominal pain. EXAM: TRANSABDOMINAL AND TRANSVAGINAL ULTRASOUND OF PELVIS DOPPLER ULTRASOUND OF OVARIES TECHNIQUE: Both transabdominal and transvaginal ultrasound examinations of the pelvis were performed. Transabdominal technique was performed for global imaging of the pelvis including uterus, ovaries, adnexal regions, and pelvic cul-de-sac. It was necessary to proceed with endovaginal exam following the transabdominal exam to visualize the uterus, endometrium, and ovaries. Color and duplex Doppler ultrasound was utilized to evaluate blood flow to the ovaries. COMPARISON:  Prior CT from 08/18/2018 FINDINGS: Uterus Measurements: 9.1 x 5.2 x 5.5 cm. No fibroids or other mass visualized. Few small nabothian cysts noted. Endometrium Thickness: At 5.2 mm. No focal abnormality visualized. IUD in appropriate position within the endometrial cavity. Right ovary Measurements: 3.5 x 2.6 x 2.5 cm. Normal appearance/no adnexal mass. 1.2 x 1.4 x 1.2 cm simple physiologic follicular cyst/dominant follicle. Left ovary Measurements: 3.1 x 1.6 x 2.2 cm. Normal appearance/no adnexal mass. 1.0 x 0.9 x 1.1 cm cyst with internal septation. No other internal complexity or vascularity. These are most consistent with small physiologic follicular cyst/dominant follicles. Pulsed Doppler evaluation of both ovaries demonstrates normal low-resistance arterial and venous waveforms. Other findings No abnormal free fluid. IMPRESSION: 1. No acute abnormality within the pelvis.  No evidence for torsion. 2. IUD in appropriate position within the endometrial cavity. Electronically Signed   By: Rise Mu M.D.   On: 08/18/2018 18:56   US Pelvis Complete  Result Date: 08/18/2018 CLINICAL DATA:  Initial evaluation for acute left lower quadrant abdominal pain. EXAM: TRANSABDOMINAL AND TRANSVAGINAL ULTRASOUND OF PELVIS DOPPLER  ULTRASOUND OF OVARIES TECHNIQUE: Both transabdominal and transvaginal ultrasound examinations of the pelvis were performed. Transabdominal technique was performed for global imaging of the pelvis including uterus, ovaries, adnexal regions, and pelvic cul-de-sac. It was necessary to proceed with endovaginal exam following the transabdominal exam to visualize the uterus, endometrium, and ovaries. Color and duplex Doppler ultrasound was utilized to evaluate blood flow to the ovaries. COMPARISON:  Prior CT from 08/18/2018 FINDINGS: Uterus Measurements: 9.1 x 5.2 x 5.5 cm. No fibroids or other mass visualized. Few small nabothian cysts noted. Endometrium Thickness: At 5.2 mm. No focal abnormality visualized. IUD in appropriate position within the endometrial cavity. Right ovary Measurements: 3.5 x 2.6 x 2.5 cm. Normal appearance/no adnexal mass. 1.2 x 1.4 x 1.2 cm simple physiologic follicular cyst/dominant follicle. Left ovary Measurements: 3.1 x 1.6 x 2.2 cm. Normal appearance/no adnexal mass. 1.0  x 0.9 x 1.1 cm cyst with internal septation. No other internal complexity or vascularity. These are most consistent with small physiologic follicular cyst/dominant follicles. Pulsed Doppler evaluation of both ovaries demonstrates normal low-resistance arterial and venous waveforms. Other findings No abnormal free fluid. IMPRESSION: 1. No acute abnormality within the pelvis.  No evidence for torsion. 2. IUD in appropriate position within the endometrial cavity. Electronically Signed   By: Rise Mu M.D.   On: 08/18/2018 18:56   Ct Abdomen Pelvis W Contrast  Result Date: 08/18/2018 CLINICAL DATA:  Left lower quadrant abdominal pain. Partial colectomy for complicated diverticulitis in August 2019. EXAM: CT ABDOMEN AND PELVIS WITH CONTRAST TECHNIQUE: Multidetector CT imaging of the abdomen and pelvis was performed using the standard protocol following bolus administration of intravenous contrast. CONTRAST:   ISOVUE-300 IOPAMIDOL (ISOVUE-300) INJECTION 61% COMPARISON:  CT scan dated 10/20/2017 FINDINGS: Lower chest: Normal. Hepatobiliary: No focal liver abnormality is seen. No gallstones, gallbladder wall thickening, or biliary dilatation. Pancreas: Unremarkable. No pancreatic ductal dilatation or surrounding inflammatory changes. Spleen: Normal in size without focal abnormality. Adrenals/Urinary Tract: Adrenal glands are unremarkable. Kidneys are normal, without renal calculi, focal lesion, or hydronephrosis. Bladder is unremarkable. Stomach/Bowel: Interval appendectomy and sigmoid colostomy. Staple line in the mid sigmoid region. Distal sigmoid and rectum appear normal. No inflammatory changes in or around the bowel. Vascular/Lymphatic: No significant vascular findings are present. No enlarged abdominal or pelvic lymph nodes. Reproductive: IUD in place.  Uterus and adnexa are otherwise normal. Other: No free air or free fluid. Scarring at the site of midline abdominal incision. Musculoskeletal: No acute or significant osseous findings. IMPRESSION: No acute abnormalities of the abdomen or pelvis. Postsurgical changes as described. Electronically Signed   By: Francene Boyers M.D.   On: 08/18/2018 15:52   [place] Art/ven Flow Abd Pelv Doppler  Result Date: 08/18/2018 CLINICAL DATA:  Initial evaluation for acute left lower quadrant abdominal pain. EXAM: TRANSABDOMINAL AND TRANSVAGINAL ULTRASOUND OF PELVIS DOPPLER ULTRASOUND OF OVARIES TECHNIQUE: Both transabdominal and transvaginal ultrasound examinations of the pelvis were performed. Transabdominal technique was performed for global imaging of the pelvis including uterus, ovaries, adnexal regions, and pelvic cul-de-sac. It was necessary to proceed with endovaginal exam following the transabdominal exam to visualize the uterus, endometrium, and ovaries. Color and duplex Doppler ultrasound was utilized to evaluate blood flow to the ovaries. COMPARISON:  Prior CT from  08/18/2018 FINDINGS: Uterus Measurements: 9.1 x 5.2 x 5.5 cm. No fibroids or other mass visualized. Few small nabothian cysts noted. Endometrium Thickness: At 5.2 mm. No focal abnormality visualized. IUD in appropriate position within the endometrial cavity. Right ovary Measurements: 3.5 x 2.6 x 2.5 cm. Normal appearance/no adnexal mass. 1.2 x 1.4 x 1.2 cm simple physiologic follicular cyst/dominant follicle. Left ovary Measurements: 3.1 x 1.6 x 2.2 cm. Normal appearance/no adnexal mass. 1.0 x 0.9 x 1.1 cm cyst with internal septation. No other internal complexity or vascularity. These are most consistent with small physiologic follicular cyst/dominant follicles. Pulsed Doppler evaluation of both ovaries demonstrates normal low-resistance arterial and venous waveforms. Other findings No abnormal free fluid. IMPRESSION: 1. No acute abnormality within the pelvis.  No evidence for torsion. 2. IUD in appropriate position within the endometrial cavity. Electronically Signed   By: Rise Mu M.D.   On: 08/18/2018 18:56    Procedures Procedures (including critical care time)  Medications Ordered in ED Medications  iopamidol (ISOVUE-300) 61 % injection (has no administration in time range)  sodium chloride 0.9 % injection (has  no administration in time range)  morphine 4 MG/ML injection 4 mg (4 mg Intravenous Given 08/18/18 1432)  ondansetron (ZOFRAN) injection 4 mg (4 mg Intravenous Given 08/18/18 1432)  iopamidol (ISOVUE-300) 61 % injection 100 mL (100 mLs Intravenous Contrast Given 08/18/18 1533)  morphine 4 MG/ML injection 4 mg (4 mg Intravenous Given 08/18/18 2004)     Initial Impression / Assessment and Plan / ED Course  I have reviewed the triage vital signs and the nursing notes.  Pertinent labs & imaging results that were available during my care of the patient were reviewed by me and considered in my medical decision making (see chart for details).     Patient with left-sided  abdominal pain beginning yesterday.  She is afebrile, vital signs are stable.  She is nontoxic in appearance.  No guarding or peritoneal signs noted on examination of the abdomen.  Midline surgical incision appears well-healing with no signs of acute secondary skin infection.  Stoma site with no active bleeding or tenderness.  Lab work shows mild non-specific leukocytosis, no anemia, no metabolic derangements.  LFTs, lipase, and creatinine within normal limits.  UA does not show evidence of UTI or nephrolithiasis.  CT of the abdomen and pelvis shows no acute abnormalities.  Does show postsurgical changes.  Pelvic ultrasound with no evidence of ovarian torsion, TOA or ectopic pregnancy.  Pelvic exam is not suggestive of PID though she does have clue cells and WBCs on wet prep suggestive of BV.  She is currently treating this with a vaginal suppository.  She states she developed a BV infection after developing a yeast infection.  She would like to try a course of p.o. Flagyl which I think is reasonable.  While in the ED she has been resting comfortably no apparent distress.  Pain has been managed and serial abdominal examinations remain benign.  Tolerating p.o. fluids in the ED without difficulty. Stable for discharge home with follow-up with PCP and general surgeon for reevaluation.  Discussed strict ED return precautions.  Patient and patient's mother verbalized understanding of and agreement with plan and patient is stable for discharge home at this time.  Final Clinical Impressions(s) / ED Diagnoses   Final diagnoses:  Left sided abdominal pain  BV (bacterial vaginosis)    ED Discharge Orders         Ordered    metroNIDAZOLE (FLAGYL) 500 MG tablet  2 times daily     08/18/18 1920    ondansetron (ZOFRAN ODT) 4 MG disintegrating tablet  Every 8 hours PRN     08/18/18 1920           Bennye Alm 08/18/18 2031    Azalia Bilis, MD 08/20/18 [phone]

## 2018-08-19 LAB — GC/CHLAMYDIA PROBE AMP (~~LOC~~) NOT AT ARMC
Chlamydia: NEGATIVE
Neisseria Gonorrhea: NEGATIVE

## 2018-10-12 ENCOUNTER — Emergency Department (HOSPITAL_COMMUNITY): Payer: 59

## 2018-10-12 ENCOUNTER — Emergency Department (HOSPITAL_COMMUNITY)
Admission: EM | Admit: 2018-10-12 | Discharge: 2018-10-12 | Disposition: A | Discharge status: Home / Self Care | Payer: 59 | Attending: Emergency Medicine | Admitting: Emergency Medicine

## 2018-10-12 ENCOUNTER — Encounter (HOSPITAL_COMMUNITY): Payer: Self-pay | Admitting: *Deleted

## 2018-10-12 DIAGNOSIS — S4991XA Unspecified injury of right shoulder and upper arm, initial encounter: Secondary | ICD-10-CM | POA: Diagnosis not present

## 2018-10-12 DIAGNOSIS — W010XXA Fall on same level from slipping, tripping and stumbling without subsequent striking against object, initial encounter: Secondary | ICD-10-CM | POA: Diagnosis not present

## 2018-10-12 DIAGNOSIS — Z79899 Other long term (current) drug therapy: Secondary | ICD-10-CM | POA: Insufficient documentation

## 2018-10-12 DIAGNOSIS — Y9389 Activity, other specified: Secondary | ICD-10-CM | POA: Insufficient documentation

## 2018-10-12 DIAGNOSIS — Y92009 Unspecified place in unspecified non-institutional (private) residence as the place of occurrence of the external cause: Secondary | ICD-10-CM | POA: Insufficient documentation

## 2018-10-12 DIAGNOSIS — W19XXXA Unspecified fall, initial encounter: Secondary | ICD-10-CM

## 2018-10-12 DIAGNOSIS — M25521 Pain in right elbow: Secondary | ICD-10-CM

## 2018-10-12 DIAGNOSIS — S50311A Abrasion of right elbow, initial encounter: Secondary | ICD-10-CM | POA: Insufficient documentation

## 2018-10-12 DIAGNOSIS — Z86718 Personal history of other venous thrombosis and embolism: Secondary | ICD-10-CM | POA: Diagnosis not present

## 2018-10-12 DIAGNOSIS — M25511 Pain in right shoulder: Secondary | ICD-10-CM

## 2018-10-12 DIAGNOSIS — Z23 Encounter for immunization: Secondary | ICD-10-CM | POA: Diagnosis not present

## 2018-10-12 DIAGNOSIS — S8991XA Unspecified injury of right lower leg, initial encounter: Secondary | ICD-10-CM | POA: Diagnosis present

## 2018-10-12 DIAGNOSIS — M25561 Pain in right knee: Secondary | ICD-10-CM

## 2018-10-12 DIAGNOSIS — Y998 Other external cause status: Secondary | ICD-10-CM | POA: Diagnosis not present

## 2018-10-12 MED ORDER — TETANUS-DIPHTH-ACELL PERTUSSIS 5-2.5-18.5 LF-MCG/0.5 IM SUSP
0.5000 mL | Freq: Once | INTRAMUSCULAR | Status: AC
  Administered 2018-10-12: 0.5 mL via INTRAMUSCULAR
  Filled 2018-10-12: qty 0.5

## 2018-10-12 MED ORDER — TRAMADOL HCL 50 MG PO TABS
50.0000 mg | ORAL_TABLET | Freq: Once | ORAL | Status: AC
  Administered 2018-10-12: 50 mg via ORAL
  Filled 2018-10-12: qty 1

## 2018-10-12 MED ORDER — ACETAMINOPHEN 500 MG PO TABS
1000.0000 mg | ORAL_TABLET | Freq: Once | ORAL | Status: AC
  Administered 2018-10-12: 1000 mg via ORAL
  Filled 2018-10-12: qty 2

## 2018-10-12 NOTE — ED Provider Notes (Signed)
[facility]-EMERGENCY DEPT Provider Note   CSN: [id number] Arrival date & time: 10/12/18  0865     History   Chief Complaint Chief Complaint  Patient presents with  . Fall  . Knee Pain  . Elbow Pain    HPI Cindy Wilkerson is a 42 y.o. female.  HPI   Patient is a 42 year old female with a history of anxiety/depression, DVT/PE, splenic artery aneurysm, thyroid disease, who presents the emergency department today for evaluation after a mechanical fall that occurred prior to arrival.  Patient states that she tripped over her pants and fell forward onto her right knee and right elbow.  She also fell somewhat on her right shoulder.  She denies head trauma or LOC.  She has some right-sided neck pain.  No numbness to the legs or arms.  Has been ambulatory since this occurred.  States pain is constant in nature.  It is severe.  Pain is exacerbated by palpation.  She denies use of blood thinners.  Past Medical History:  Diagnosis Date  . Anxiety   . Depression   . DVT (deep venous thrombosis) (HCC)   . Migraine   . PE (pulmonary embolism)   . Splenic artery aneurysm (HCC)   . Thyroid disease     There are no active problems to display for this patient.   Past Surgical History:  Procedure Laterality Date  . TONSILLECTOMY    . TUBAL LIGATION       OB History   None      Home Medications    Prior to Admission medications   Medication Sig Start Date End Date Taking? Authorizing Provider  albuterol (ACCUNEB) 1.25 MG/3ML nebulizer solution Inhale 3 mLs into the lungs 3 (three) times daily as needed for wheezing. 11/02/17   [provider]  albuterol (PROVENTIL HFA;VENTOLIN HFA) 108 (90 Base) MCG/ACT inhaler Inhale 1-2 puffs into the lungs daily as needed for wheezing. 02/19/17   [provider]  ARIPiprazole (ABILIFY DISCMELT PO) Take 5 mg by mouth daily.     [provider]  CLEOCIN 100 MG vaginal suppository Place 1  suppository vaginally once a week. 08/05/18   [provider]  cyclobenzaprine (FLEXERIL) 10 MG tablet May take 1/2 tablet to 1 whole tablet by mouth at night and up to 2 times a day as needed for muscle pain/spasms. Patient not taking: Reported on 08/18/2018 12/15/17   Sudie Grumbling, NP  cyclobenzaprine (FLEXERIL) 5 MG tablet Take 5 mg by mouth daily as needed for pain. 08/09/18   [provider]  DULoxetine (CYMBALTA) 60 MG capsule Take 120 mg by mouth daily.     [provider]  levothyroxine (SYNTHROID, LEVOTHROID) 200 MCG tablet Take 175 mcg by mouth daily before breakfast.     [provider]  lisinopril (PRINIVIL,ZESTRIL) 10 MG tablet Take 10 mg by mouth daily.    [provider]  LORazepam (ATIVAN) 0.5 MG tablet Take 0.5 mg by mouth 2 (two) times daily as needed for anxiety. 08/01/15   [provider]  metroNIDAZOLE (FLAGYL) 500 MG tablet Take 1 tablet (500 mg total) by mouth 2 (two) times daily. 08/18/18   Fawze, Mina A, PA-C  Multiple Vitamin (MULTIVITAMIN) tablet Take 1 tablet by mouth daily.    [provider]  naproxen (NAPROSYN) 500 MG tablet Take 1 tablet (500 mg total) by mouth 2 (two) times daily as needed for moderate pain. Patient not taking: Reported on 08/18/2018 12/15/17  Sudie GrumblingAmyot, Ann Berry, NP  nystatin ointment (MYCOSTATIN) Apply 1 application topically at bedtime. 08/11/18   [provider]  omeprazole (PRILOSEC) 20 MG capsule Take 20 mg by mouth daily.    [provider]  ondansetron (ZOFRAN ODT) 4 MG disintegrating tablet Take 1 tablet (4 mg total) by mouth every 8 (eight) hours as needed for nausea or vomiting. 08/18/18   Fawze, Mina A, PA-C  Probiotic Product (PROBIOTIC ADVANCED) CAPS Take 1 capsule by mouth daily.    [provider]  SUMAtriptan (IMITREX) 100 MG tablet Take 100 mg by mouth daily as needed for migraine. 06/05/15   [provider]  topiramate (TOPAMAX) 100 MG  tablet Take 200 mg by mouth 2 (two) times daily.     [provider]    Family History No family history on file.  Social History Social History   Tobacco Use  . Smoking status: Never Smoker  . Smokeless tobacco: Never Used  Substance Use Topics  . Alcohol use: Yes    Comment: occ  . Drug use: No     Allergies   Amoxil [amoxicillin]; Ciprofloxacin; Sulfa antibiotics; and Wellbutrin [bupropion]   Review of Systems Review of Systems  Constitutional: Negative for fever.  Respiratory: Negative for shortness of breath.   Cardiovascular: Negative for chest pain.  Gastrointestinal: Negative for vomiting.  Genitourinary: Negative for flank pain.  Musculoskeletal: Positive for neck pain (right). Negative for back pain.       Right shoulder, right elbow, and right knee pain  Skin: Positive for wound.  Neurological:       No head trauma or loc     Physical Exam Updated Vital Signs BP 138/82   Pulse 78   Temp 98 F (36.7 C) (Oral)   Resp 16   Ht 5\' 5"  (1.651 m)   Wt 114.3 kg   LMP 10/03/2018   SpO2 100%   BMI 41.93 kg/m   Physical Exam  Constitutional: She appears well-developed and well-nourished. No distress.  HENT:  Head: Normocephalic and atraumatic.  Eyes: Conjunctivae are normal.  Neck: Neck supple.  Cardiovascular: Normal rate and regular rhythm.  Pulmonary/Chest: Effort normal and breath sounds normal. She has no wheezes.  Abdominal: Soft. There is tenderness (at incision site).  Recent colectomy wound with ostomy bag in place. Dressing intact.  Musculoskeletal: Normal range of motion.  No TTP to the cervical, thoracic, or lumbar spine. Mild right cervical paraspinous TTP. TTP to the lateral aspect of the right shoulder. TTP to the proximal ulna with overlying abrasion. No TTP to the medial/lateral epicondyle or olecranon. TTP to the distal patella. No significant TTP to the tib/fib. FROM and 5/5 strength to the BUE and BLE.   Neurological: She is  alert.  Skin: Skin is warm and dry. Capillary refill takes less than 2 seconds.  Psychiatric: She has a normal mood and affect.  Nursing note and vitals reviewed.   ED Treatments / Results  Labs (all labs ordered are listed, but only abnormal results are displayed) Labs Reviewed - No data to display  EKG None  Radiology Dg Shoulder Right  Result Date: 10/12/2018 CLINICAL DATA:  Tripped and fell off a sidewalk, RIGHT shoulder pain EXAM: RIGHT SHOULDER - 2+ VIEW COMPARISON:  None FINDINGS: Osseous mineralization normal. AC joint alignment normal. No acute fracture, dislocation or bone destruction. Visualized ribs unremarkable. IMPRESSION: Normal exam. Electronically Signed   By: Ulyses SouthwardMark  Boles M.D.   On: 10/12/2018 11:21   Dg Elbow  Complete Right  Result Date: 10/12/2018 CLINICAL DATA:  Fall, elbow pain EXAM: RIGHT ELBOW - COMPLETE 3+ VIEW COMPARISON:  None. FINDINGS: There is no evidence of fracture, dislocation, or joint effusion. There is no evidence of arthropathy or other focal bone abnormality. Soft tissues are unremarkable. IMPRESSION: Negative. Electronically Signed   By: Charlett Nose M.D.   On: 10/12/2018 09:33   Dg Knee Complete 4 Views Right  Result Date: 10/12/2018 CLINICAL DATA:  Larey Seat today with right knee pain anteriorly and laterally EXAM: RIGHT KNEE - COMPLETE 4+ VIEW COMPARISON:  Right knee films of 03/07/2016 FINDINGS: The right knee joint spaces appear well preserved. No significant degenerative change or posttraumatic change is seen. The patella is normally positioned. No joint space effusion is seen. IMPRESSION: Negative. Electronically Signed   By: Dwyane Dee M.D.   On: 10/12/2018 09:33    Procedures Procedures (including critical care time)  Medications Ordered in ED Medications  acetaminophen (TYLENOL) tablet 1,000 mg (1,000 mg Oral Given 10/12/18 1105)  Tdap (BOOSTRIX) injection 0.5 mL (0.5 mLs Intramuscular Given 10/12/18 1105)  traMADol (ULTRAM) tablet 50  mg (50 mg Oral Given 10/12/18 1228)     Initial Impression / Assessment and Plan / ED Course  I have reviewed the triage vital signs and the nursing notes.  Pertinent labs & imaging results that were available during my care of the patient were reviewed by me and considered in my medical decision making (see chart for details).  Final Clinical Impressions(s) / ED Diagnoses   Final diagnoses:  Fall, initial encounter  Right elbow pain  Acute pain of right knee  Acute pain of right shoulder   Pt with mechanical fall PTA. No head trauma or loc. No midline spinal ttp. C/o pain to right shoulder, right elbow and right knee. Small abrasions to right elbow. Tdap updated. Xray right elbow negative. Xray right knee negative. Xray right shoulder negative. Will provide knee sleeve and crutches and have pt f/u with pcp. Advised to return if worse. Pt voices understanding of plan and reasons to return. All questions answered.  ED Discharge Orders    None       Karrie Meres, [place] 10/12/18 1642    Geoffery Lyons, MD 10/13/18 [phone]

## 2018-10-12 NOTE — ED Triage Notes (Signed)
Per EMS, pt tripped on sidewalk, felt onto right knee. Pt complains right knee, ankle, elbow, shoulder pain. Pt denies head injury, loss of consciousness, is not on blood thinners.

## 2018-10-12 NOTE — Discharge Instructions (Addendum)
You may alternate taking Tylenol and Ibuprofen as needed for pain control. You may take 400-600 mg of ibuprofen every 6 hours and [phone] mg of Tylenol every 6 hours. Do not exceed 4000 mg of Tylenol daily as this can lead to liver damage. Also, make sure to take Ibuprofen with meals as it can cause an upset stomach. Do not take other NSAIDs while taking Ibuprofen such as (Aleve, Naprosyn, Aspirin, Celebrex, etc) and do not take more than the prescribed dose as this can lead to ulcers and bleeding in your GI tract. You may use warm and cold compresses to help with your symptoms.   Please follow up with your primary doctor within the next 7-10 days for re-evaluation and further treatment of your symptoms.   You may also follow up with orthopedics if you continue to have symptoms.  Please return to the ER sooner if you have any new or worsening symptoms.

## 2018-10-12 NOTE — ED Notes (Signed)
Patient given discharge teaching and verbalized understanding. Signature pad not available for disposition paperwork. Patient ambulated out of ED with a steady gait.

## 2018-10-12 NOTE — ED Notes (Signed)
Ortho was contacted for application of knee sleeve and crutches.

## 2018-11-26 ENCOUNTER — Ambulatory Visit (INDEPENDENT_AMBULATORY_CARE_PROVIDER_SITE_OTHER): Payer: PRIVATE HEALTH INSURANCE | Admitting: Psychology

## 2018-11-26 DIAGNOSIS — F331 Major depressive disorder, recurrent, moderate: Secondary | ICD-10-CM | POA: Diagnosis not present

## 2018-12-07 ENCOUNTER — Ambulatory Visit (INDEPENDENT_AMBULATORY_CARE_PROVIDER_SITE_OTHER): Payer: PRIVATE HEALTH INSURANCE | Admitting: Psychology

## 2018-12-07 DIAGNOSIS — F331 Major depressive disorder, recurrent, moderate: Secondary | ICD-10-CM

## 2018-12-15 ENCOUNTER — Ambulatory Visit (INDEPENDENT_AMBULATORY_CARE_PROVIDER_SITE_OTHER): Payer: PRIVATE HEALTH INSURANCE | Admitting: Psychology

## 2018-12-15 DIAGNOSIS — F331 Major depressive disorder, recurrent, moderate: Secondary | ICD-10-CM

## 2018-12-22 ENCOUNTER — Ambulatory Visit (INDEPENDENT_AMBULATORY_CARE_PROVIDER_SITE_OTHER): Payer: PRIVATE HEALTH INSURANCE | Admitting: Psychology

## 2018-12-22 DIAGNOSIS — F331 Major depressive disorder, recurrent, moderate: Secondary | ICD-10-CM

## 2018-12-29 ENCOUNTER — Ambulatory Visit (INDEPENDENT_AMBULATORY_CARE_PROVIDER_SITE_OTHER): Payer: PRIVATE HEALTH INSURANCE | Admitting: Psychology

## 2018-12-29 DIAGNOSIS — F331 Major depressive disorder, recurrent, moderate: Secondary | ICD-10-CM | POA: Diagnosis not present

## 2018-12-29 DIAGNOSIS — F4323 Adjustment disorder with mixed anxiety and depressed mood: Secondary | ICD-10-CM

## 2019-01-05 ENCOUNTER — Ambulatory Visit (INDEPENDENT_AMBULATORY_CARE_PROVIDER_SITE_OTHER): Payer: PRIVATE HEALTH INSURANCE | Admitting: Psychology

## 2019-01-05 DIAGNOSIS — F331 Major depressive disorder, recurrent, moderate: Secondary | ICD-10-CM | POA: Diagnosis not present

## 2019-01-12 ENCOUNTER — Other Ambulatory Visit: Payer: Self-pay

## 2019-01-12 ENCOUNTER — Ambulatory Visit (INDEPENDENT_AMBULATORY_CARE_PROVIDER_SITE_OTHER): Payer: PRIVATE HEALTH INSURANCE | Admitting: Psychology

## 2019-01-12 DIAGNOSIS — F4323 Adjustment disorder with mixed anxiety and depressed mood: Secondary | ICD-10-CM | POA: Diagnosis not present

## 2019-01-12 DIAGNOSIS — F330 Major depressive disorder, recurrent, mild: Secondary | ICD-10-CM

## 2019-01-26 ENCOUNTER — Ambulatory Visit (INDEPENDENT_AMBULATORY_CARE_PROVIDER_SITE_OTHER): Payer: PRIVATE HEALTH INSURANCE | Admitting: Psychology

## 2019-01-26 DIAGNOSIS — F330 Major depressive disorder, recurrent, mild: Secondary | ICD-10-CM

## 2019-02-02 ENCOUNTER — Ambulatory Visit: Payer: PRIVATE HEALTH INSURANCE | Admitting: Psychology

## 2019-02-23 ENCOUNTER — Ambulatory Visit (INDEPENDENT_AMBULATORY_CARE_PROVIDER_SITE_OTHER): Payer: PRIVATE HEALTH INSURANCE | Admitting: Psychology

## 2019-02-23 DIAGNOSIS — F330 Major depressive disorder, recurrent, mild: Secondary | ICD-10-CM

## 2019-02-23 DIAGNOSIS — F4323 Adjustment disorder with mixed anxiety and depressed mood: Secondary | ICD-10-CM | POA: Diagnosis not present

## 2019-03-01 ENCOUNTER — Ambulatory Visit: Payer: PRIVATE HEALTH INSURANCE | Admitting: Psychology

## 2019-03-07 ENCOUNTER — Ambulatory Visit (INDEPENDENT_AMBULATORY_CARE_PROVIDER_SITE_OTHER): Payer: PRIVATE HEALTH INSURANCE | Admitting: Psychology

## 2019-03-07 DIAGNOSIS — F330 Major depressive disorder, recurrent, mild: Secondary | ICD-10-CM

## 2019-03-22 ENCOUNTER — Ambulatory Visit (INDEPENDENT_AMBULATORY_CARE_PROVIDER_SITE_OTHER): Payer: PRIVATE HEALTH INSURANCE | Admitting: Psychology

## 2019-03-22 DIAGNOSIS — F330 Major depressive disorder, recurrent, mild: Secondary | ICD-10-CM

## 2019-04-07 ENCOUNTER — Ambulatory Visit (INDEPENDENT_AMBULATORY_CARE_PROVIDER_SITE_OTHER): Payer: PRIVATE HEALTH INSURANCE | Admitting: Psychology

## 2019-04-07 DIAGNOSIS — F330 Major depressive disorder, recurrent, mild: Secondary | ICD-10-CM

## 2019-04-21 ENCOUNTER — Ambulatory Visit (INDEPENDENT_AMBULATORY_CARE_PROVIDER_SITE_OTHER): Payer: PRIVATE HEALTH INSURANCE | Admitting: Psychology

## 2019-04-21 DIAGNOSIS — F4323 Adjustment disorder with mixed anxiety and depressed mood: Secondary | ICD-10-CM | POA: Diagnosis not present

## 2019-04-21 DIAGNOSIS — F330 Major depressive disorder, recurrent, mild: Secondary | ICD-10-CM | POA: Diagnosis not present

## 2019-05-05 ENCOUNTER — Ambulatory Visit: Payer: PRIVATE HEALTH INSURANCE | Admitting: Psychology

## 2019-05-17 ENCOUNTER — Ambulatory Visit (INDEPENDENT_AMBULATORY_CARE_PROVIDER_SITE_OTHER): Payer: PRIVATE HEALTH INSURANCE | Admitting: Psychology

## 2019-05-17 DIAGNOSIS — F330 Major depressive disorder, recurrent, mild: Secondary | ICD-10-CM

## 2019-06-02 ENCOUNTER — Ambulatory Visit (INDEPENDENT_AMBULATORY_CARE_PROVIDER_SITE_OTHER): Payer: PRIVATE HEALTH INSURANCE | Admitting: Psychology

## 2019-06-02 DIAGNOSIS — F330 Major depressive disorder, recurrent, mild: Secondary | ICD-10-CM

## 2019-06-16 ENCOUNTER — Ambulatory Visit: Payer: PRIVATE HEALTH INSURANCE | Admitting: Psychology

## 2019-06-30 ENCOUNTER — Ambulatory Visit (INDEPENDENT_AMBULATORY_CARE_PROVIDER_SITE_OTHER): Payer: PRIVATE HEALTH INSURANCE | Admitting: Psychology

## 2019-06-30 DIAGNOSIS — F4323 Adjustment disorder with mixed anxiety and depressed mood: Secondary | ICD-10-CM

## 2019-06-30 DIAGNOSIS — F330 Major depressive disorder, recurrent, mild: Secondary | ICD-10-CM

## 2019-07-19 ENCOUNTER — Ambulatory Visit (INDEPENDENT_AMBULATORY_CARE_PROVIDER_SITE_OTHER): Payer: PRIVATE HEALTH INSURANCE | Admitting: Psychology

## 2019-07-19 DIAGNOSIS — F4323 Adjustment disorder with mixed anxiety and depressed mood: Secondary | ICD-10-CM

## 2019-07-19 DIAGNOSIS — F330 Major depressive disorder, recurrent, mild: Secondary | ICD-10-CM | POA: Diagnosis not present

## 2019-07-20 ENCOUNTER — Ambulatory Visit: Payer: PRIVATE HEALTH INSURANCE | Admitting: Psychology

## 2019-07-26 ENCOUNTER — Ambulatory Visit (INDEPENDENT_AMBULATORY_CARE_PROVIDER_SITE_OTHER): Payer: PRIVATE HEALTH INSURANCE | Admitting: Psychology

## 2019-07-26 DIAGNOSIS — F4323 Adjustment disorder with mixed anxiety and depressed mood: Secondary | ICD-10-CM

## 2019-07-26 DIAGNOSIS — F330 Major depressive disorder, recurrent, mild: Secondary | ICD-10-CM

## 2019-08-11 ENCOUNTER — Ambulatory Visit: Payer: PRIVATE HEALTH INSURANCE | Admitting: Psychology

## 2020-02-22 IMAGING — CT CT ABD-PELV W/ CM
2 of 5 series · 16 of 46 positions shown, 18 images · IV contrast (iopamidol)
Comparison: CT scan dated 10/20/2017

CLINICAL DATA: Left lower quadrant abdominal pain. Partial
colectomy for complicated diverticulitis in June 2018.

EXAM:
CT ABDOMEN AND PELVIS WITH CONTRAST
TECHNIQUE: Multidetector CT imaging of the abdomen and pelvis was performed
using the standard protocol following bolus administration of
intravenous contrast.
CONTRAST:  100mL HP4T9B-TGG IOPAMIDOL (HP4T9B-TGG) INJECTION 61%

[Series 2: axial st · axial · 0.98mm/px · z∈[+1288,+1698]mm · 13 of 96 slices shown, 15 images]
[im 7/96  soft-tissue]
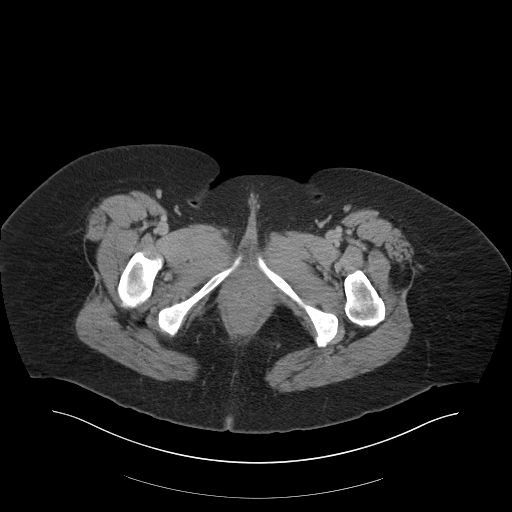
[im 7/96  bone]
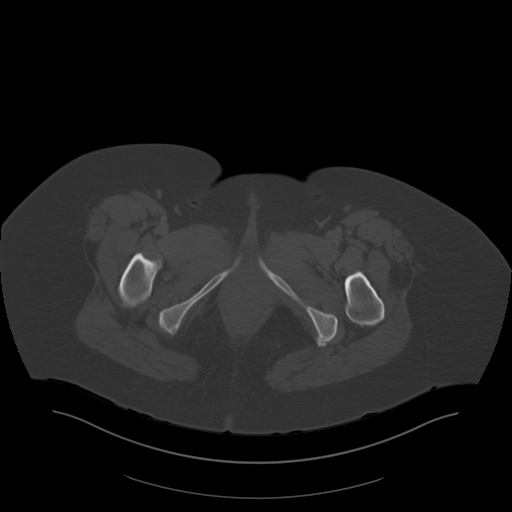
[im 14/96  soft-tissue]
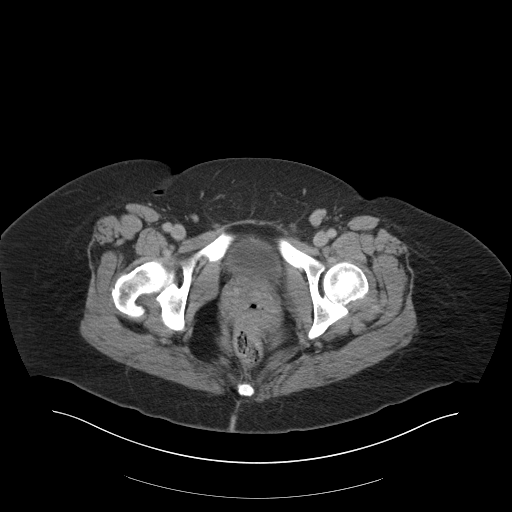
[im 21/96  soft-tissue]
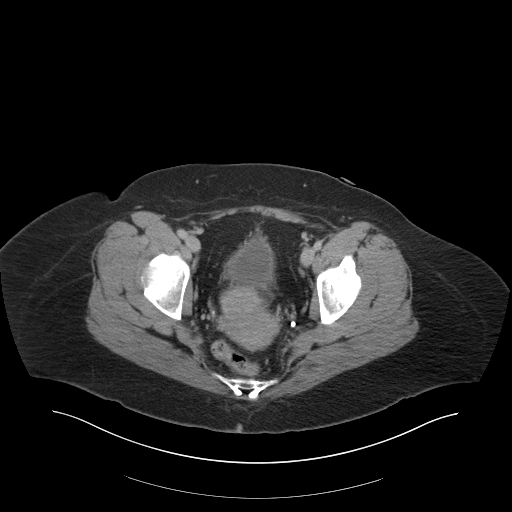
[im 28/96  soft-tissue]
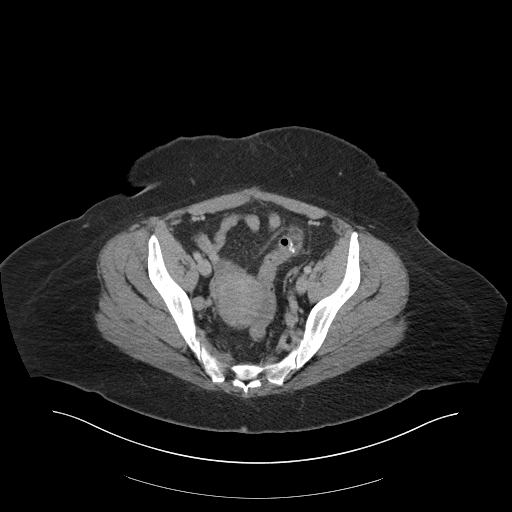
[im 34/96  soft-tissue]
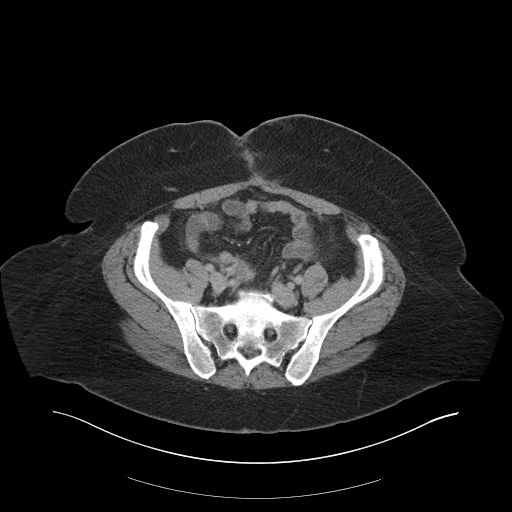
[im 41/96  soft-tissue]
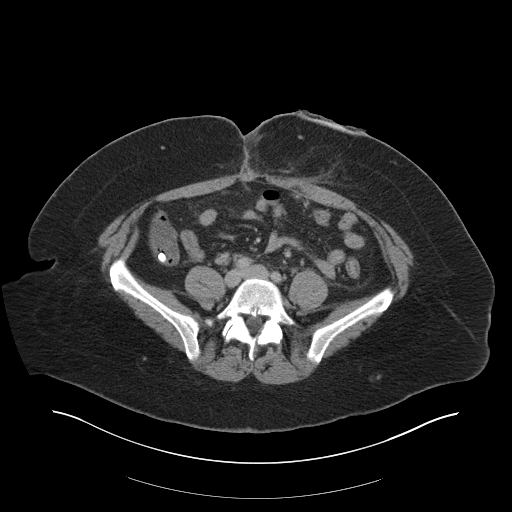
[im 48/96  soft-tissue]
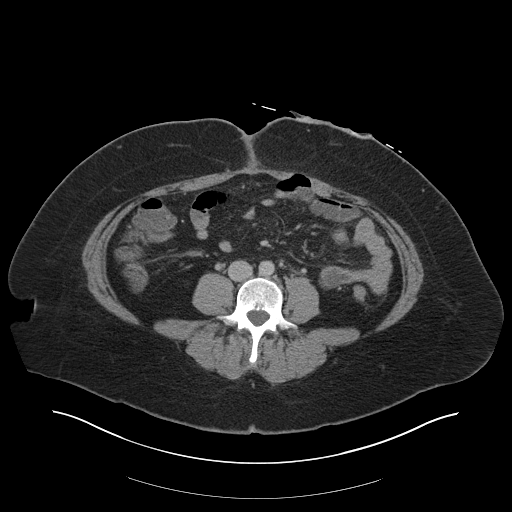
[im 55/96  soft-tissue]
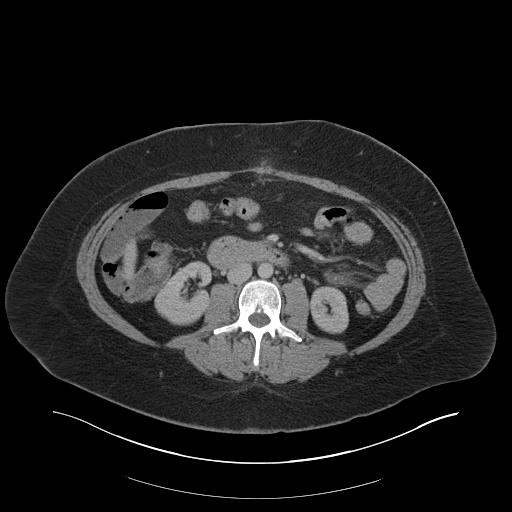
[im 62/96  soft-tissue]
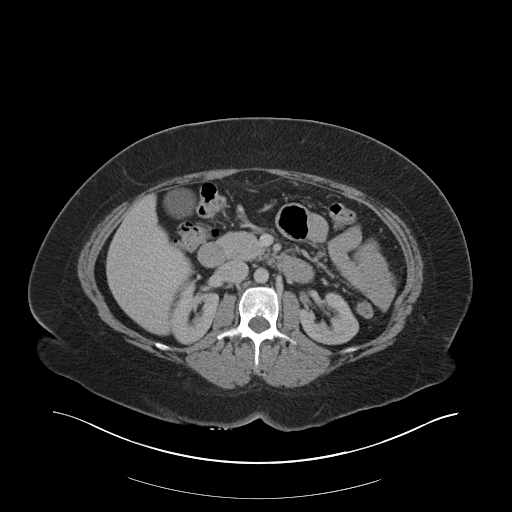
[im 62/96  bone]
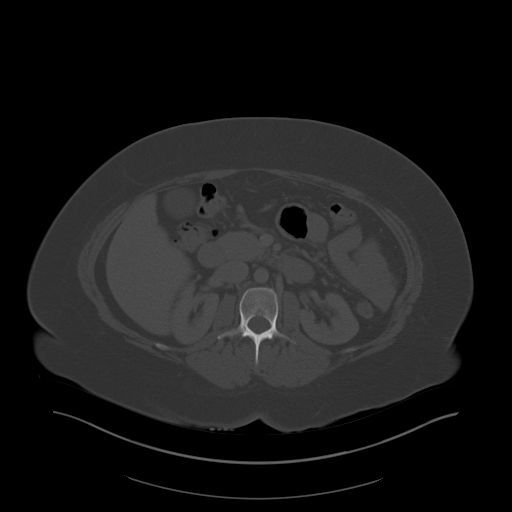
[im 68/96  soft-tissue]
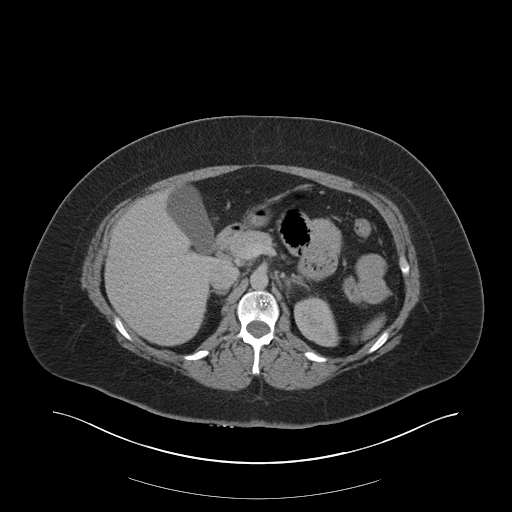
[im 75/96  soft-tissue]
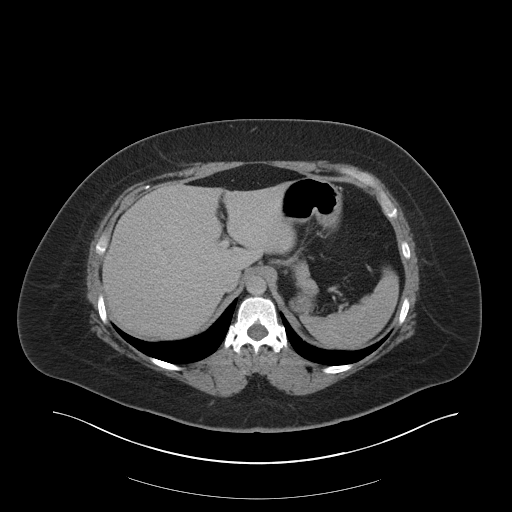
[im 82/96  soft-tissue]
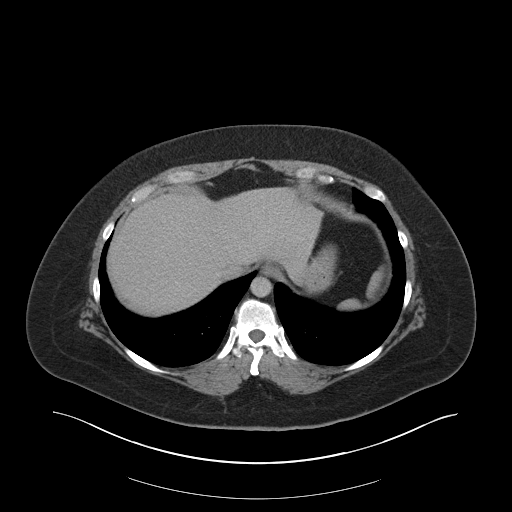
[im 89/96  soft-tissue]
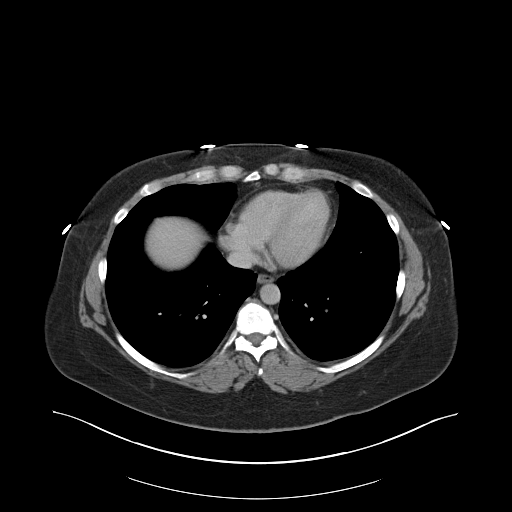

[Series 4: coronal st · coronal · 0.90mm/px · 3 of 115 slices shown]
[im 39/115  soft-tissue]
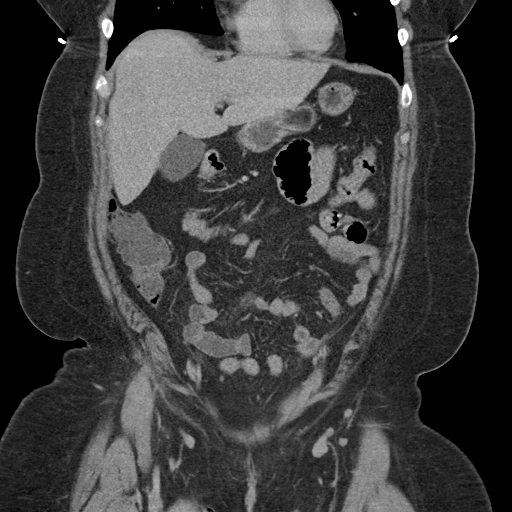
[im 51/115  soft-tissue]
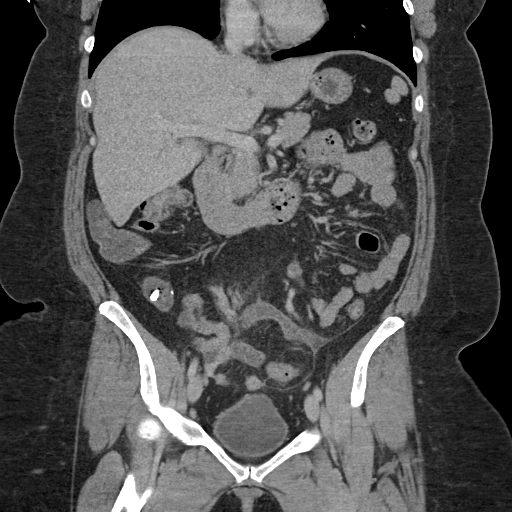
[im 64/115  soft-tissue]
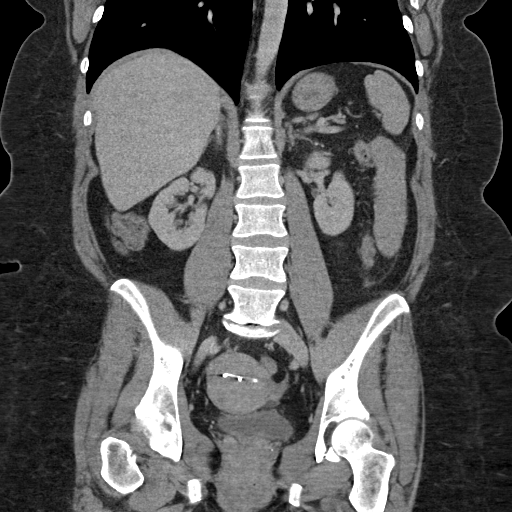

[16 of 46 positions shown; findings below may reference images not displayed]

FINDINGS: Lower chest: Normal.

Hepatobiliary: No focal liver abnormality is seen. No gallstones,
gallbladder wall thickening, or biliary dilatation.

Pancreas: Unremarkable. No pancreatic ductal dilatation or
surrounding inflammatory changes.

Spleen: Normal in size without focal abnormality.

Adrenals/Urinary Tract: Adrenal glands are unremarkable. Kidneys are
normal, without renal calculi, focal lesion, or hydronephrosis.
Bladder is unremarkable.

Stomach/Bowel: Interval appendectomy and sigmoid colostomy. Staple
line in the mid sigmoid region. Distal sigmoid and rectum appear
normal. No inflammatory changes in or around the bowel.

Vascular/Lymphatic: No significant vascular findings are present. No
enlarged abdominal or pelvic lymph nodes.

Reproductive: IUD in place.  Uterus and adnexa are otherwise normal.

Other: No free air or free fluid. Scarring at the site of midline
abdominal incision.

Musculoskeletal: No acute or significant osseous findings.
IMPRESSION: No acute abnormalities of the abdomen or pelvis. Postsurgical
changes as described.

## 2020-02-22 IMAGING — US US ART/VEN ABD/PELV/SCROTUM DOPPLER LTD
2 series · 13 of 25 positions shown · non-contrast
Comparison: Prior CT from 08/18/2018

CLINICAL DATA: Initial evaluation for acute left lower quadrant
abdominal pain.

EXAM:
TRANSABDOMINAL AND TRANSVAGINAL ULTRASOUND OF PELVIS
DOPPLER ULTRASOUND OF OVARIES
TECHNIQUE: Both transabdominal and transvaginal ultrasound examinations of the
pelvis were performed. Transabdominal technique was performed for
global imaging of the pelvis including uterus, ovaries, adnexal
regions, and pelvic cul-de-sac.
It was necessary to proceed with endovaginal exam following the
transabdominal exam to visualize the uterus, endometrium, and
ovaries. Color and duplex Doppler ultrasound was utilized to
evaluate blood flow to the ovaries.

[Series 1: us art/ven abd/pelv/scrotum doppler ltd · 0.26mm/px · 1 of 4 slices shown (1 of 2)]
[im 1/4]
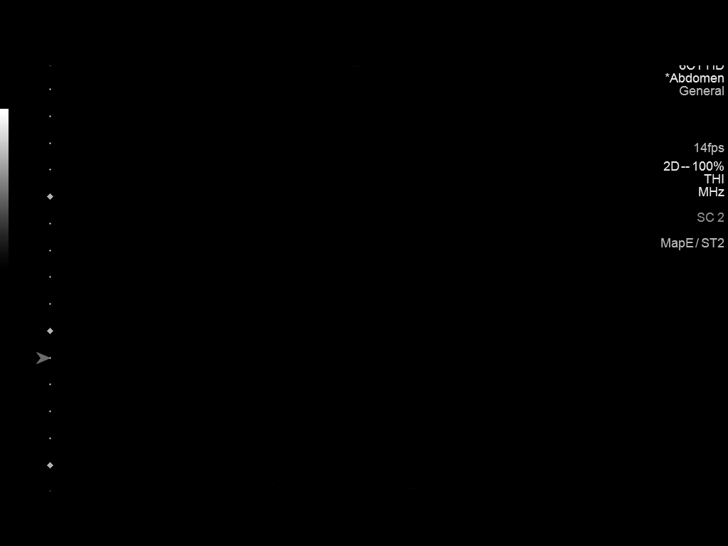

[Series 1: us art/ven abd/pelv/scrotum doppler ltd · 12 of 211 slices shown (2 of 2)]
[im 10/211]
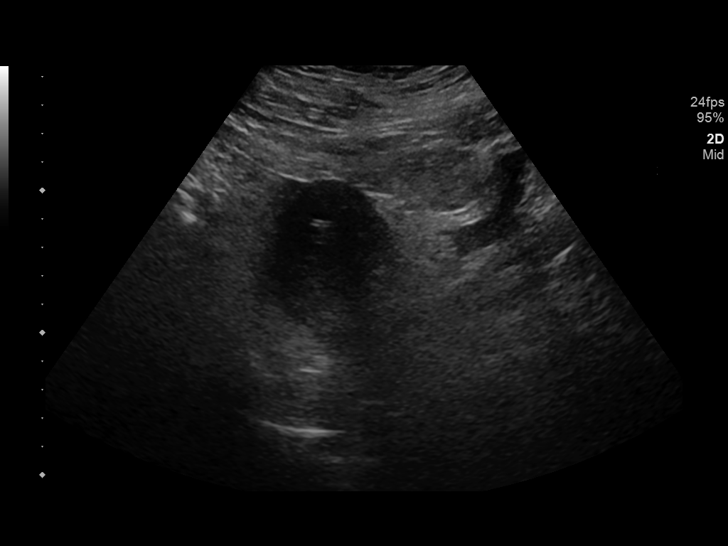
[im 28/211]
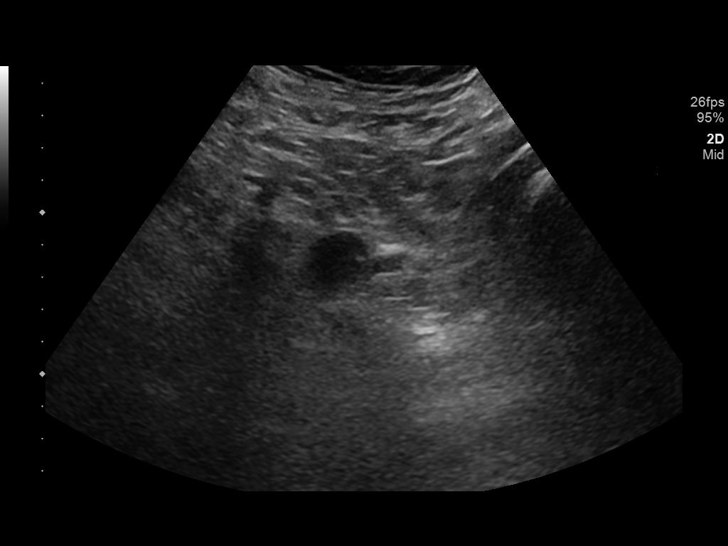
[im 46/211]
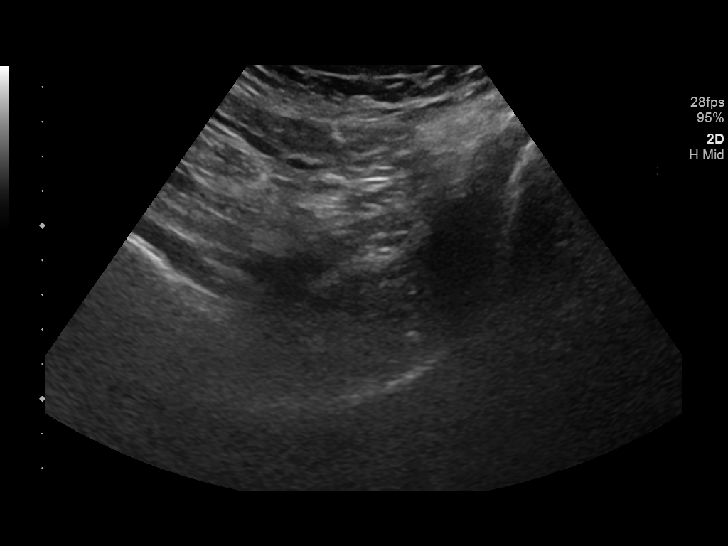
[im 64/211]
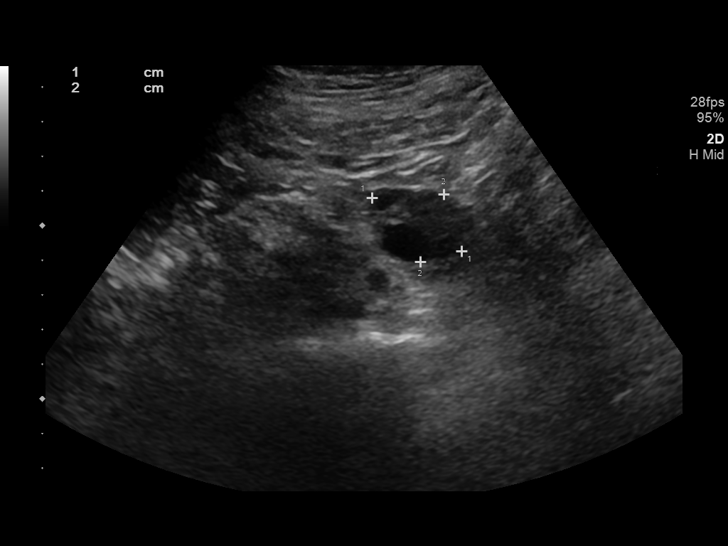
[im 83/211]
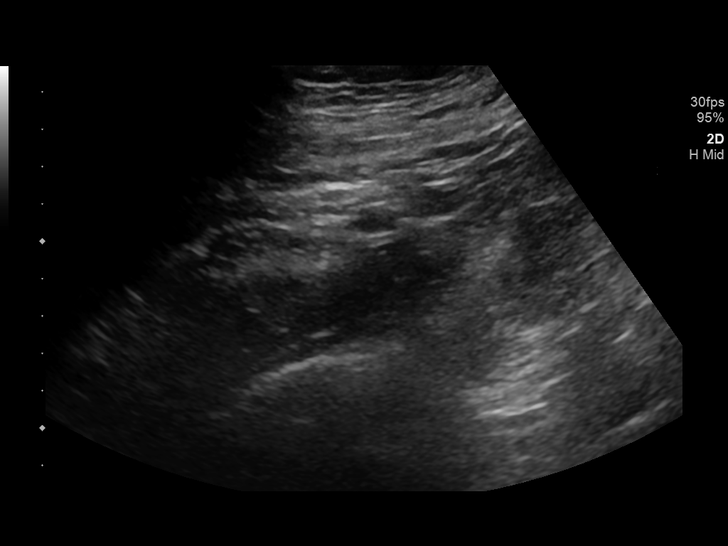
[im 101/211]
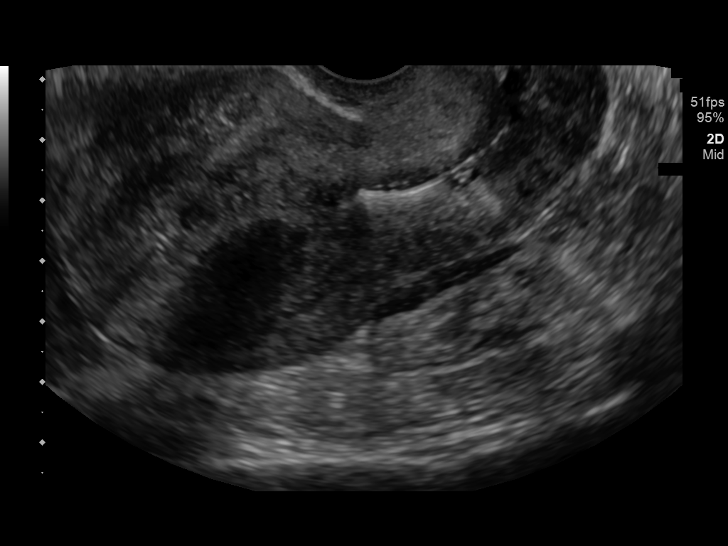
[im 119/211]
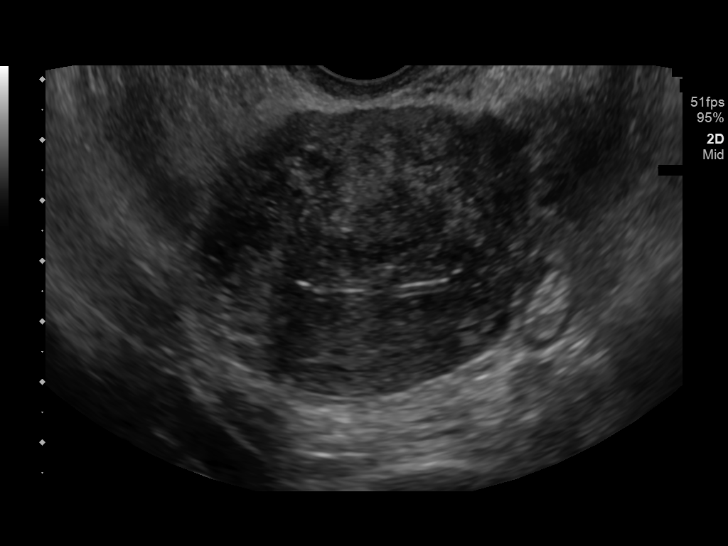
[im 137/211]
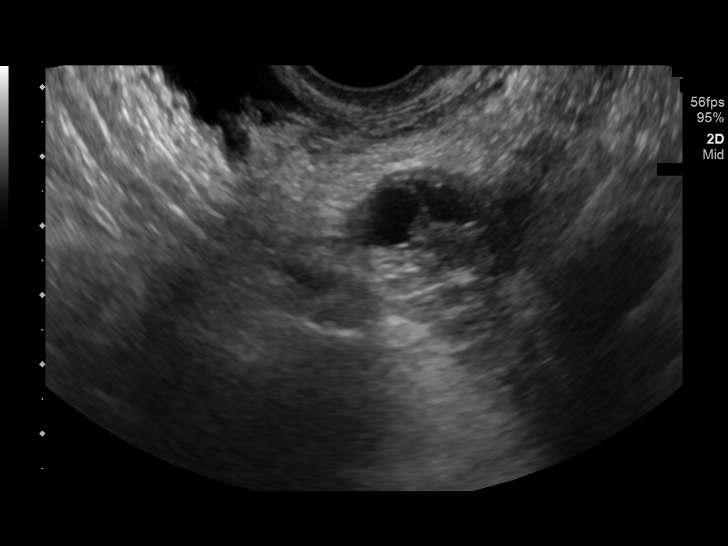
[im 156/211]
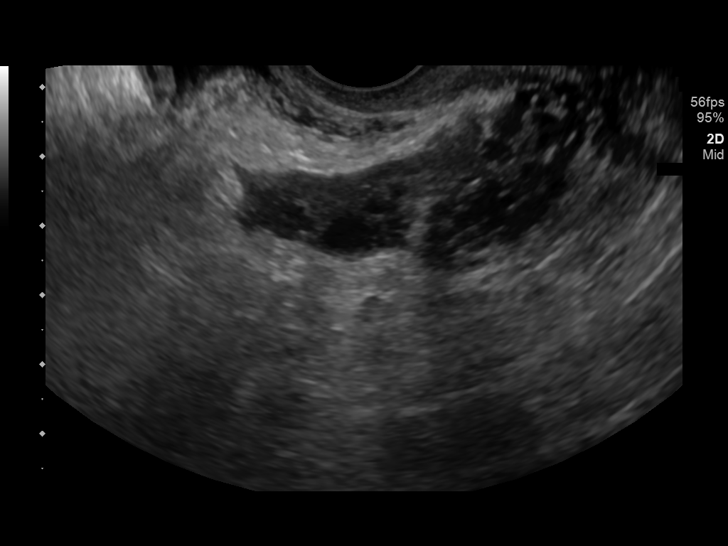
[im 174/211]
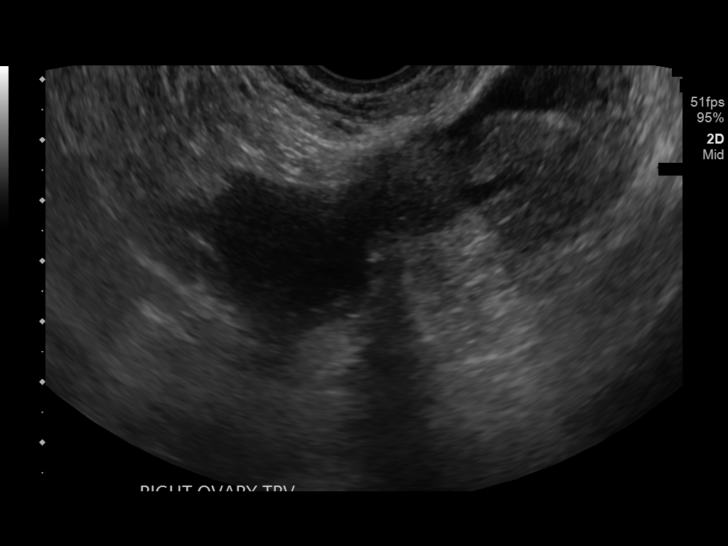
[im 192/211]
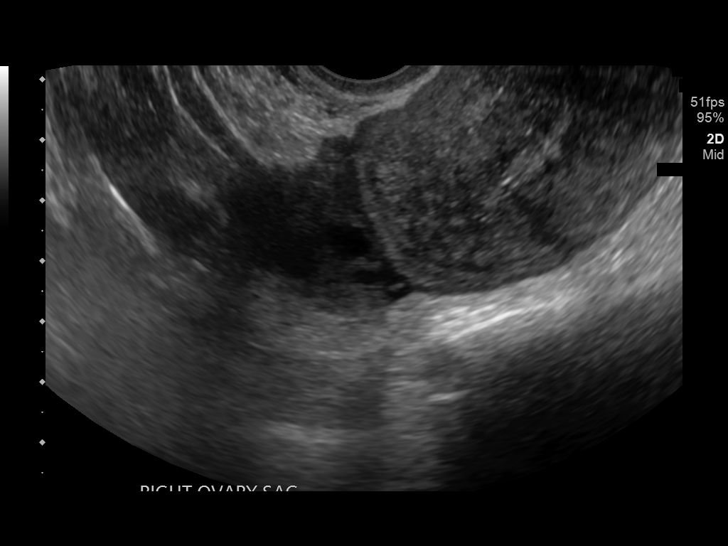
[im 211/211]
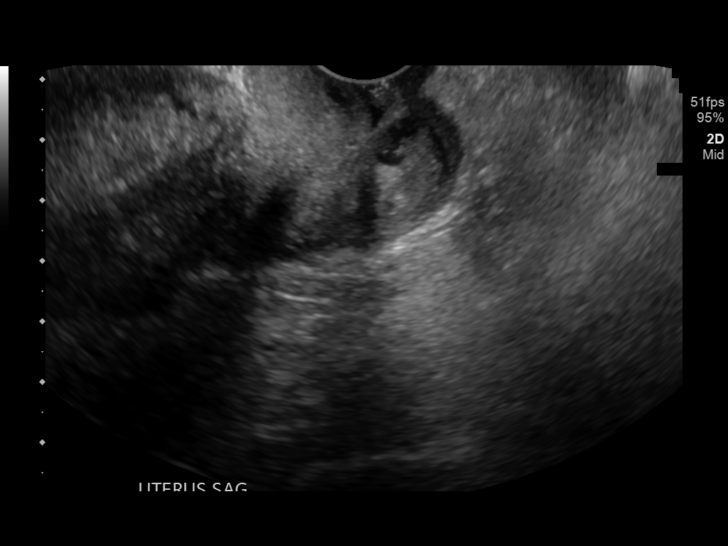

[13 of 25 positions shown; findings below may reference images not displayed]

FINDINGS: Uterus

Measurements: 9.1 x 5.2 x 5.5 cm. No fibroids or other mass
visualized. Few small nabothian cysts noted.

Endometrium

Thickness: At 5.2 mm. No focal abnormality visualized. IUD in
appropriate position within the endometrial cavity.

Right ovary

Measurements: 3.5 x 2.6 x 2.5 cm. Normal appearance/no adnexal mass.
1.2 x 1.4 x 1.2 cm simple physiologic follicular cyst/dominant
follicle.

Left ovary

Measurements: 3.1 x 1.6 x 2.2 cm. Normal appearance/no adnexal mass.
1.0 x 0.9 x 1.1 cm cyst with internal septation. No other internal
complexity or vascularity. These are most consistent with small
physiologic follicular cyst/dominant follicles.

Pulsed Doppler evaluation of both ovaries demonstrates normal
low-resistance arterial and venous waveforms.

Other findings

No abnormal free fluid.
IMPRESSION: 1. No acute abnormality within the pelvis.  No evidence for torsion.
2. IUD in appropriate position within the endometrial cavity.

## 2020-11-06 ENCOUNTER — Other Ambulatory Visit: Payer: Self-pay

## 2020-11-06 ENCOUNTER — Ambulatory Visit: Payer: Self-pay | Attending: Internal Medicine

## 2020-11-06 DIAGNOSIS — Z23 Encounter for immunization: Secondary | ICD-10-CM

## 2020-11-06 NOTE — Progress Notes (Signed)
   Covid-19 Vaccination Clinic  Name:  HOLLYE PRITT    MRN: [id number] DOB: [birth year]  11/06/2020  Ms. Sobotka was observed post Covid-19 immunization for 15 minutes without incident. She was provided with Vaccine Information Sheet and instruction to access the V-Safe system.   Ms. Reininger was instructed to call 911 with any severe reactions post vaccine: Marland Kitchen Difficulty breathing  . Swelling of face and throat  . A fast heartbeat  . A bad rash all over body  . Dizziness and weakness   Immunizations Administered    Name Date Dose VIS Date Route   Pfizer COVID-19 Vaccine 11/06/2020  1:26 PM 0.3 mL 08/22/2020 Intramuscular   Manufacturer: [employer], [employer]   Lot: [id number]   NDC: [address]-3

## 2020-11-27 ENCOUNTER — Ambulatory Visit: Payer: Self-pay | Attending: Pulmonary Disease

## 2020-11-27 DIAGNOSIS — Z23 Encounter for immunization: Secondary | ICD-10-CM

## 2020-11-27 NOTE — Progress Notes (Signed)
   Covid-19 Vaccination Clinic  Name:  KATHERYN CULLITON    MRN: [id number] DOB: [birth year]  11/27/2020  Ms. Ryle was observed post Covid-19 immunization for 15 minutes without incident. She was provided with Vaccine Information Sheet and instruction to access the V-Safe system.   Ms. Battle was instructed to call 911 with any severe reactions post vaccine: Marland Kitchen Difficulty breathing  . Swelling of face and throat  . A fast heartbeat  . A bad rash all over body  . Dizziness and weakness   Immunizations Administered    Name Date Dose VIS Date Route   PFIZER Comrnaty(Gray TOP) Covid-19 Vaccine 11/27/2020  1:10 PM 0.3 mL 10/11/2020 Intramuscular   Manufacturer: [employer], [employer]   Lot: [id number]   NDC: [phone]

## 2022-02-21 DIAGNOSIS — N390 Urinary tract infection, site not specified: Secondary | ICD-10-CM | POA: Diagnosis not present

## 2022-02-21 DIAGNOSIS — D72829 Elevated white blood cell count, unspecified: Secondary | ICD-10-CM | POA: Insufficient documentation

## 2022-02-21 DIAGNOSIS — R1030 Lower abdominal pain, unspecified: Secondary | ICD-10-CM | POA: Diagnosis present

## 2022-02-21 DIAGNOSIS — R197 Diarrhea, unspecified: Secondary | ICD-10-CM | POA: Diagnosis not present

## 2022-02-22 ENCOUNTER — Encounter (HOSPITAL_COMMUNITY): Payer: Self-pay | Admitting: Emergency Medicine

## 2022-02-22 ENCOUNTER — Emergency Department (HOSPITAL_COMMUNITY)
Admission: EM | Admit: 2022-02-22 | Discharge: 2022-02-22 | Disposition: A | Discharge status: Home / Self Care | Payer: Medicaid - Out of State | Attending: Emergency Medicine | Admitting: Emergency Medicine

## 2022-02-22 ENCOUNTER — Emergency Department (HOSPITAL_COMMUNITY): Payer: Medicaid - Out of State

## 2022-02-22 ENCOUNTER — Other Ambulatory Visit: Payer: Self-pay

## 2022-02-22 DIAGNOSIS — N390 Urinary tract infection, site not specified: Secondary | ICD-10-CM

## 2022-02-22 DIAGNOSIS — R1030 Lower abdominal pain, unspecified: Secondary | ICD-10-CM

## 2022-02-22 LAB — COMPREHENSIVE METABOLIC PANEL
ALT: 25 U/L (ref 0–44)
AST: 17 U/L (ref 15–41)
Albumin: 3.4 g/dL — ABNORMAL LOW (ref 3.5–5.0)
Alkaline Phosphatase: 50 U/L (ref 38–126)
Anion gap: 4 — ABNORMAL LOW (ref 5–15)
BUN: 11 mg/dL (ref 6–20)
CO2: 25 mmol/L (ref 22–32)
Calcium: 8.4 mg/dL — ABNORMAL LOW (ref 8.9–10.3)
Chloride: 109 mmol/L (ref 98–111)
Creatinine, Ser: 0.91 mg/dL (ref 0.44–1.00)
GFR, Estimated: >60 mL/min (ref >60)
Glucose, Bld: 94 mg/dL (ref 70–99)
Potassium: 3.8 mmol/L (ref 3.5–5.1)
Sodium: 138 mmol/L (ref 135–145)
Total Bilirubin: 0.4 mg/dL (ref 0.3–1.2)
Total Protein: 6.5 g/dL (ref 6.5–8.1)

## 2022-02-22 LAB — CBC
HCT: 41 % (ref 36.0–46.0)
Hemoglobin: 13.5 g/dL (ref 12.0–15.0)
MCH: 30.8 pg (ref 26.0–34.0)
MCHC: 32.9 g/dL (ref 30.0–36.0)
MCV: 93.6 fL (ref 80.0–100.0)
Platelets: 249 10*3/uL (ref 150–400)
RBC: 4.38 MIL/uL (ref 3.87–5.11)
RDW: 12.9 % (ref 11.5–15.5)
WBC: 12.6 10*3/uL — ABNORMAL HIGH (ref 4.0–10.5)
nRBC: 0 % (ref 0.0–0.2)

## 2022-02-22 LAB — URINALYSIS, ROUTINE W REFLEX MICROSCOPIC
Bilirubin Urine: NEGATIVE
Glucose, UA: NEGATIVE mg/dL
Ketones, ur: NEGATIVE mg/dL
Nitrite: NEGATIVE
Protein, ur: NEGATIVE mg/dL
Specific Gravity, Urine: 1.016 (ref 1.005–1.030)
WBC, UA: >50 WBC/hpf — ABNORMAL HIGH (ref 0–5)
pH: 5 (ref 5.0–8.0)

## 2022-02-22 LAB — LIPASE, BLOOD: Lipase: 28 U/L (ref 11–51)

## 2022-02-22 LAB — POC URINE PREG, ED: Preg Test, Ur: NEGATIVE

## 2022-02-22 MED ORDER — OXYCODONE-ACETAMINOPHEN 5-325 MG PO TABS: 1.0000 | ORAL_TABLET | Freq: Four times a day (QID) | ORAL | 0 refills | Status: DC | PRN

## 2022-02-22 MED ORDER — SODIUM CHLORIDE 0.9 % IV SOLN
2.0000 g | Freq: Once | INTRAVENOUS | Status: AC
  Administered 2022-02-22: 2 g via INTRAVENOUS
  Filled 2022-02-22: qty 20

## 2022-02-22 MED ORDER — IOHEXOL 300 MG/ML  SOLN
100.0000 mL | Freq: Once | INTRAMUSCULAR | Status: AC | PRN
  Administered 2022-02-22: 100 mL via INTRAVENOUS

## 2022-02-22 MED ORDER — MORPHINE SULFATE (PF) 4 MG/ML IV SOLN
4.0000 mg | Freq: Once | INTRAVENOUS | Status: AC
  Administered 2022-02-22: 4 mg via INTRAVENOUS
  Filled 2022-02-22: qty 1

## 2022-02-22 MED ORDER — CEPHALEXIN 500 MG PO CAPS: 500.0000 mg | ORAL_CAPSULE | Freq: Three times a day (TID) | ORAL | 0 refills | Status: DC

## 2022-02-22 MED ORDER — OXYCODONE-ACETAMINOPHEN 5-325 MG PO TABS: 1.0000 | ORAL_TABLET | ORAL | 0 refills | Status: DC | PRN

## 2022-02-22 MED ORDER — CEPHALEXIN 500 MG PO CAPS: 500.0000 mg | ORAL_CAPSULE | Freq: Three times a day (TID) | ORAL | 0 refills | Status: AC

## 2022-02-22 NOTE — ED Notes (Signed)
ED Provider at bedside. 

## 2022-02-22 NOTE — ED Notes (Signed)
BP cuff removed from pt per pt request ?

## 2022-02-22 NOTE — ED Provider Notes (Signed)
?[facility] ?Provider Note ? ? ?CSN: [id number] ?Arrival date & time: 02/21/22  2357 ? ?  ? ?History ? ?Chief Complaint  ?Patient presents with  ? Abdominal Pain  ? ? ?Cindy Wilkerson is a 46 y.o. female. ? ?The history is provided by the patient.  ?Abdominal Pain ?She has history of DVT and pulmonary embolism but not currently anticoagulated, perforated diverticulitis and comes in with pain across her lower abdomen which started this morning and is gotten progressively worse.  There are some radiation to the back.  There has been no nausea or vomiting.  She has not had any fever or chills.  There has been diarrhea but pain is not affected by having a bowel movement.  Pain is similar to what she had with her prior perforated diverticulitis. ?  ?Home Medications ?Prior to Admission medications   ?Medication Sig Start Date End Date Taking? Authorizing Provider  ?albuterol (ACCUNEB) 1.25 MG/3ML nebulizer solution Inhale 3 mLs into the lungs 3 (three) times daily as needed for wheezing. 11/02/17   [provider]  ?albuterol (PROVENTIL HFA;VENTOLIN HFA) 108 (90 Base) MCG/ACT inhaler Inhale 1-2 puffs into the lungs daily as needed for wheezing. 02/19/17   [provider]  ?ARIPiprazole (ABILIFY DISCMELT PO) Take 5 mg by mouth daily.     [provider]  ?CLEOCIN 100 MG vaginal suppository Place 1 suppository vaginally once a week. 08/05/18   [provider]  ?cyclobenzaprine (FLEXERIL) 10 MG tablet May take 1/2 tablet to 1 whole tablet by mouth at night and up to 2 times a day as needed for muscle pain/spasms. ?Patient not taking: Reported on 08/18/2018 12/15/17   Sudie GrumblingAmyot, Ann Berry, NP  ?cyclobenzaprine (FLEXERIL) 5 MG tablet Take 5 mg by mouth daily as needed for pain. 08/09/18   [provider]  ?DULoxetine (CYMBALTA) 60 MG capsule Take 120 mg by mouth daily.     [provider]  ?levothyroxine (SYNTHROID, LEVOTHROID) 200 MCG tablet Take 175 mcg by  mouth daily before breakfast.     [provider]  ?lisinopril (PRINIVIL,ZESTRIL) 10 MG tablet Take 10 mg by mouth daily.    [provider]  ?LORazepam (ATIVAN) 0.5 MG tablet Take 0.5 mg by mouth 2 (two) times daily as needed for anxiety. 08/01/15   [provider]  ?metroNIDAZOLE (FLAGYL) 500 MG tablet Take 1 tablet (500 mg total) by mouth 2 (two) times daily. 08/18/18   Jeanie SewerFawze, Mina A, PA-C  ?Multiple Vitamin (MULTIVITAMIN) tablet Take 1 tablet by mouth daily.    [provider]  ?naproxen (NAPROSYN) 500 MG tablet Take 1 tablet (500 mg total) by mouth 2 (two) times daily as needed for moderate pain. ?Patient not taking: Reported on 08/18/2018 12/15/17   Sudie GrumblingAmyot, Ann Berry, NP  ?nystatin ointment (MYCOSTATIN) Apply 1 application topically at bedtime. 08/11/18   [provider]  ?omeprazole (PRILOSEC) 20 MG capsule Take 20 mg by mouth daily.    [provider]  ?ondansetron (ZOFRAN ODT) 4 MG disintegrating tablet Take 1 tablet (4 mg total) by mouth every 8 (eight) hours as needed for nausea or vomiting. 08/18/18   Jeanie SewerFawze, Mina A, PA-C  ?Probiotic Product (PROBIOTIC ADVANCED) CAPS Take 1 capsule by mouth daily.    [provider]  ?SUMAtriptan (IMITREX) 100 MG tablet Take 100 mg by mouth daily as needed for migraine. 06/05/15   [provider]  ?topiramate (TOPAMAX) 100 MG tablet Take 200 mg by mouth 2 (two) times daily.  [provider]  ?   ? ?Allergies    ?Amoxil [amoxicillin], Ciprofloxacin, Sulfa antibiotics, and Wellbutrin [bupropion]   ? ?Review of Systems   ?Review of Systems  ?Gastrointestinal:  Positive for abdominal pain.  ?All other systems reviewed and are negative. ? ?Physical Exam ?Updated Vital Signs ?BP 127/82   Pulse 85   Temp 98.4 ?F (36.9 ?C) (Oral)   Resp (!) 21   Ht 5\' 5"  (1.651 m)   Wt 104.3 kg   SpO2 95%   BMI 38.27 kg/m?  ?Physical Exam ?Vitals and nursing note reviewed.  ?46 year old female, resting  comfortably and in no acute distress. Vital signs are normal. Oxygen saturation is 95%, which is normal. ?Head is normocephalic and atraumatic. PERRLA, EOMI. Oropharynx is clear. ?Neck is nontender and supple without adenopathy or JVD. ?Back is nontender and there is no CVA tenderness. ?Lungs are clear without rales, wheezes, or rhonchi. ?Chest is nontender. ?Heart has regular rate and rhythm without murmur. ?Abdomen is soft, flat, with moderate to severe tenderness across the lower abdomen.  There is no rebound or guarding.  There are no masses or hepatosplenomegaly and peristalsis is hypo-oactive. ?Extremities have no cyanosis or edema, full range of motion is present. ?Skin is warm and dry without rash. ?Neurologic: Mental status is normal, cranial nerves are intact, moves all extremities equally. ? ?ED Results / Procedures / Treatments   ?Labs ?(all labs ordered are listed, but only abnormal results are displayed) ?Labs Reviewed  ?COMPREHENSIVE METABOLIC PANEL - Abnormal; Notable for the following components:  ?    Result Value  ? Calcium 8.4 (*)   ? Albumin 3.4 (*)   ? Anion gap 4 (*)   ? All other components within normal limits  ?CBC - Abnormal; Notable for the following components:  ? WBC 12.6 (*)   ? All other components within normal limits  ?URINALYSIS, ROUTINE W REFLEX MICROSCOPIC - Abnormal; Notable for the following components:  ? APPearance HAZY (*)   ? Hgb urine dipstick SMALL (*)   ? Leukocytes,Ua LARGE (*)   ? WBC, UA >50 (*)   ? Bacteria, UA RARE (*)   ? All other components within normal limits  ?URINE CULTURE  ?LIPASE, BLOOD  ?POC URINE PREG, ED  ? ?Radiology ?CT ABDOMEN PELVIS W CONTRAST ? ?Result Date: 02/22/2022 ?CLINICAL DATA:  46 year old female with history of left lower quadrant abdominal pain. Diarrhea. EXAM: CT ABDOMEN AND PELVIS WITH CONTRAST TECHNIQUE: Multidetector CT imaging of the abdomen and pelvis was performed using the standard protocol following bolus administration of  intravenous contrast. RADIATION DOSE REDUCTION: This exam was performed according to the departmental dose-optimization program which includes automated exposure control, adjustment of the mA and/or kV according to patient size and/or use of iterative reconstruction technique. CONTRAST:  59 OMNIPAQUE IOHEXOL 300 MG/ML  SOLN COMPARISON:  CT of the abdomen and pelvis 08/18/2018. FINDINGS: Lower chest: Unremarkable. Hepatobiliary: No suspicious cystic or solid hepatic lesions. No intra or extrahepatic biliary ductal dilatation. Gallbladder is unremarkable in appearance. Pancreas: No pancreatic mass. No pancreatic ductal dilatation. No pancreatic or peripancreatic fluid collections or inflammatory changes. Spleen: Unremarkable. Adrenals/Urinary Tract: Bilateral kidneys, bilateral adrenal glands and urinary bladder are normal in appearance. No hydroureteronephrosis. Stomach/Bowel: Normal appearance of the stomach. No pathologic dilatation of small bowel or colon. Postoperative changes of partial colectomy are noted in the region of the sigmoid colon. No unexpected soft tissue mass at the suture line in the sigmoid colon. Status post  appendectomy. Vascular/Lymphatic: No significant atherosclerotic disease, aneurysm or dissection noted in the abdominal or pelvic vasculature. No lymphadenopathy noted in the abdomen or pelvis. Reproductive: Status post hysterectomy. Ovaries are not confidently identified may be surgically absent or atrophic. Other: No significant volume of ascites.  No pneumoperitoneum. Musculoskeletal: There are no aggressive appearing lytic or blastic lesions noted in the visualized portions of the skeleton. IMPRESSION: 1. No acute findings are noted in the abdomen or pelvis to account for the patient's symptoms. 2. Postoperative changes and incidental findings, as above. Electronically Signed   By: Trudie Reed M.D.   On: 02/22/2022 06:08   ? ?Procedures ?Procedures  ? ? ?Medications Ordered in  ED ?Medications  ?morphine (PF) 4 MG/ML injection 4 mg (4 mg Intravenous Given 02/22/22 0341)  ? ? ?ED Course/ Medical Decision Making/ A&P ?  ?                        ?Medical Decision Making ?Amount and/or Complexity of Data Revi

## 2022-02-22 NOTE — ED Triage Notes (Addendum)
Pt here with c/o abdominal pain and thinks her colon may have ruptured (states this has happened to her in the past). Pt also states she has been having diarrhea as well. ?

## 2022-02-22 NOTE — ED Notes (Signed)
Pt stated that she is unable to provide a urine sample at this time ?

## 2022-02-22 NOTE — ED Notes (Signed)
Patient transported to CT 

## 2022-02-22 NOTE — Discharge Instructions (Addendum)
Drink plenty of fluids. ? ?Return if pain is not being adequately controlled at home or if you start running a high fever or start vomiting. ?

## 2022-02-23 LAB — URINE CULTURE: Culture: <10000 — AB

## 2022-02-24 MED FILL — Oxycodone w/ Acetaminophen Tab 5-325 MG: ORAL | Qty: 6 | Status: AC

## 2022-05-28 ENCOUNTER — Emergency Department (HOSPITAL_COMMUNITY)
Admission: EM | Admit: 2022-05-28 | Discharge: 2022-05-28 | Disposition: A | Discharge status: Home / Self Care | Payer: Medicaid - Out of State | Attending: Emergency Medicine | Admitting: Emergency Medicine

## 2022-05-28 ENCOUNTER — Other Ambulatory Visit: Payer: Self-pay

## 2022-05-28 ENCOUNTER — Encounter (HOSPITAL_COMMUNITY): Payer: Self-pay

## 2022-05-28 ENCOUNTER — Emergency Department (HOSPITAL_COMMUNITY): Payer: Medicaid - Out of State

## 2022-05-28 DIAGNOSIS — S8011XA Contusion of right lower leg, initial encounter: Secondary | ICD-10-CM | POA: Insufficient documentation

## 2022-05-28 DIAGNOSIS — S9002XA Contusion of left ankle, initial encounter: Secondary | ICD-10-CM | POA: Insufficient documentation

## 2022-05-28 DIAGNOSIS — I10 Essential (primary) hypertension: Secondary | ICD-10-CM | POA: Insufficient documentation

## 2022-05-28 DIAGNOSIS — Z79899 Other long term (current) drug therapy: Secondary | ICD-10-CM | POA: Insufficient documentation

## 2022-05-28 DIAGNOSIS — W19XXXA Unspecified fall, initial encounter: Secondary | ICD-10-CM

## 2022-05-28 DIAGNOSIS — S99912A Unspecified injury of left ankle, initial encounter: Secondary | ICD-10-CM | POA: Diagnosis present

## 2022-05-28 DIAGNOSIS — X501XXA Overexertion from prolonged static or awkward postures, initial encounter: Secondary | ICD-10-CM | POA: Insufficient documentation

## 2022-05-28 MED ORDER — HYDROMORPHONE HCL 1 MG/ML IJ SOLN
1.0000 mg | Freq: Once | INTRAMUSCULAR | Status: AC
Start: 2022-05-28 — End: 2022-05-28
  Administered 2022-05-28: 1 mg via INTRAMUSCULAR
  Filled 2022-05-28: qty 1

## 2022-05-28 MED ORDER — OXYCODONE-ACETAMINOPHEN 5-325 MG PO TABS: 1.0000 | ORAL_TABLET | Freq: Four times a day (QID) | ORAL | 0 refills | Status: AC | PRN

## 2022-05-28 MED ORDER — OXYCODONE-ACETAMINOPHEN 5-325 MG PO TABS: 1.0000 | ORAL_TABLET | Freq: Four times a day (QID) | ORAL | 0 refills | Status: DC | PRN

## 2022-05-28 NOTE — Discharge Instructions (Addendum)
Use a walker to ambulate with.  I have referred you to an orthopedic doctor or you can follow-up with your family doctor.  Keep your legs elevated.

## 2022-05-28 NOTE — ED Triage Notes (Signed)
Patient states she fell on the second to last step, rolling her left ankle and falling on her right shin. Patient has bruising to right shin with skin abrasion. Patient states increased pain in left ankle and can only bear weight for limited time.

## 2022-05-28 NOTE — ED Provider Notes (Signed)
[facility] EMERGENCY DEPARTMENT Provider Note   CSN: [id number] Arrival date & time: 05/28/22  4098     History {Add pertinent medical, surgical, social history, OB history to HPI:1} Chief Complaint  Patient presents with   Fall   Leg Pain    Cindy Wilkerson is a 46 y.o. female.  Patient fell today and hurt her left ankle and right tib-fib.   Fall  Leg Pain      Home Medications Prior to Admission medications   Medication Sig Start Date End Date Taking? Authorizing Provider  oxyCODONE-acetaminophen (PERCOCET/ROXICET) 5-325 MG tablet Take 1 tablet by mouth every 6 (six) hours as needed for severe pain. 05/28/22  Yes Bethann Berkshire, MD  albuterol (ACCUNEB) 1.25 MG/3ML nebulizer solution Inhale 3 mLs into the lungs 3 (three) times daily as needed for wheezing. 11/02/17   [provider]  albuterol (PROVENTIL HFA;VENTOLIN HFA) 108 (90 Base) MCG/ACT inhaler Inhale 1-2 puffs into the lungs daily as needed for wheezing. 02/19/17   [provider]  ARIPiprazole (ABILIFY DISCMELT PO) Take 5 mg by mouth daily.     [provider]  cephALEXin (KEFLEX) 500 MG capsule Take 1 capsule (500 mg total) by mouth 3 (three) times daily. 02/22/22   Dione Booze, MD  CLEOCIN 100 MG vaginal suppository Place 1 suppository vaginally once a week. 08/05/18   [provider]  cyclobenzaprine (FLEXERIL) 10 MG tablet May take 1/2 tablet to 1 whole tablet by mouth at night and up to 2 times a day as needed for muscle pain/spasms. Patient not taking: Reported on 08/18/2018 12/15/17   Sudie Grumbling, NP  cyclobenzaprine (FLEXERIL) 5 MG tablet Take 5 mg by mouth daily as needed for pain. 08/09/18   [provider]  DULoxetine (CYMBALTA) 60 MG capsule Take 120 mg by mouth daily.     [provider]  levothyroxine (SYNTHROID, LEVOTHROID) 200 MCG tablet Take 175 mcg by mouth daily before breakfast.     [provider]  lisinopril (PRINIVIL,ZESTRIL) 10  MG tablet Take 10 mg by mouth daily.    [provider]  LORazepam (ATIVAN) 0.5 MG tablet Take 0.5 mg by mouth 2 (two) times daily as needed for anxiety. 08/01/15   [provider]  metroNIDAZOLE (FLAGYL) 500 MG tablet Take 1 tablet (500 mg total) by mouth 2 (two) times daily. 08/18/18   Fawze, Mina A, PA-C  Multiple Vitamin (MULTIVITAMIN) tablet Take 1 tablet by mouth daily.    [provider]  naproxen (NAPROSYN) 500 MG tablet Take 1 tablet (500 mg total) by mouth 2 (two) times daily as needed for moderate pain. Patient not taking: Reported on 08/18/2018 12/15/17   Sudie Grumbling, NP  nystatin ointment (MYCOSTATIN) Apply 1 application topically at bedtime. 08/11/18   [provider]  omeprazole (PRILOSEC) 20 MG capsule Take 20 mg by mouth daily.    [provider]  ondansetron (ZOFRAN ODT) 4 MG disintegrating tablet Take 1 tablet (4 mg total) by mouth every 8 (eight) hours as needed for nausea or vomiting. 08/18/18   Fawze, Mina A, PA-C  Probiotic Product (PROBIOTIC ADVANCED) CAPS Take 1 capsule by mouth daily.    [provider]  SUMAtriptan (IMITREX) 100 MG tablet Take 100 mg by mouth daily as needed for migraine. 06/05/15   [provider]  topiramate (TOPAMAX) 100 MG tablet Take 200 mg by mouth 2 (two) times daily.     [provider]      Allergies  Amoxil [amoxicillin], Ciprofloxacin, Sulfa antibiotics, and Wellbutrin [bupropion]    Review of Systems   Review of Systems  Physical Exam Updated Vital Signs BP 133/79   Pulse 76   Temp 98.2 F (36.8 C) (Oral)   Resp 18   Ht 5\' 5"  (1.651 m)   Wt 104 kg   SpO2 99%   BMI 38.15 kg/m  Physical Exam  ED Results / Procedures / Treatments   Labs (all labs ordered are listed, but only abnormal results are displayed) Labs Reviewed - No data to display  EKG None  Radiology DG Tibia/Fibula Right  Result Date: 05/28/2022 CLINICAL DATA:  05/30/2022 last night with  pain and swelling. EXAM: RIGHT TIBIA AND FIBULA - 2 VIEW COMPARISON:  Ankle same day FINDINGS: No evidence of fracture or dislocation. Mild lateral soft tissue swelling in the ankle region. Normal ankle mortise. IMPRESSION: No fracture or dislocation. Mild lateral soft tissue swelling in the ankle region. Electronically Signed   By: Larey Seat M.D.   On: 05/28/2022 08:33   DG Ankle Complete Left  Result Date: 05/28/2022 CLINICAL DATA:  05/30/2022 with twisting of the ankle yesterday. EXAM: LEFT ANKLE COMPLETE - 3+ VIEW COMPARISON:  None Available. FINDINGS: Mild soft tissue swelling. No evidence of fracture or dislocation at the ankle. IMPRESSION: Mild soft tissue swelling.  No visible fracture or dislocation. Electronically Signed   By: Larey Seat M.D.   On: 05/28/2022 08:31    Procedures Procedures  {Document cardiac monitor, telemetry assessment procedure when appropriate:1}  Medications Ordered in ED Medications  HYDROmorphone (DILAUDID) injection 1 mg (has no administration in time range)    ED Course/ Medical Decision Making/ A&P                           Medical Decision Making Amount and/or Complexity of Data Reviewed Radiology: ordered.  Risk Prescription drug management.   Patient with contusion to left ankle and right tib-fib  {Document critical care time when appropriate:1} {Document review of labs and clinical decision tools ie heart score, Chads2Vasc2 etc:1}  {Document your independent review of radiology images, and any outside records:1} {Document your discussion with family members, caretakers, and with consultants:1} {Document social determinants of health affecting pt's care:1} {Document your decision making why or why not admission, treatments were needed:1} Final Clinical Impression(s) / ED Diagnoses Final diagnoses:  Fall, initial encounter    Rx / DC Orders ED Discharge Orders          Ordered    oxyCODONE-acetaminophen (PERCOCET/ROXICET) 5-325 MG  tablet  Every 6 hours PRN        05/28/22 0914

## 2023-02-24 NOTE — Telephone Encounter (Signed)
Sent fax to referring provider's office letting them know that we are currently not accepting referrals for non-inflammatory conditions and we are unable to accommodate patient at this time. Fax confirmation confirmed

## 2023-04-20 ENCOUNTER — Inpatient Hospital Stay
Admit: 2023-04-20 | Discharge: 2023-04-20 | Disposition: A | Discharge status: Other Acute Inpatient Hospital | Payer: MEDICAID | Attending: Student in an Organized Health Care Education/Training Program

## 2023-04-20 DIAGNOSIS — F3181 Bipolar II disorder: Secondary | ICD-10-CM

## 2023-04-20 DIAGNOSIS — M797 Fibromyalgia: Secondary | ICD-10-CM

## 2023-04-20 DIAGNOSIS — R45851 Suicidal ideations: Secondary | ICD-10-CM

## 2023-04-20 LAB — CBC WITH AUTO DIFFERENTIAL
Basophils %: 1 % (ref 0–2)
Basophils Absolute: 0.1 10*3/uL (ref 0.0–0.1)
Eosinophils %: 3 % (ref 0–5)
Eosinophils Absolute: 0.4 10*3/uL (ref 0.0–0.4)
Hematocrit: 36.8 % (ref 35.0–45.0)
Hemoglobin: 12.1 g/dL (ref 12.0–16.0)
Immature Granulocytes %: 0 % (ref 0.0–0.5)
Immature Granulocytes Absolute: 0.1 10*3/uL — ABNORMAL HIGH (ref 0.00–0.04)
Lymphocytes %: 26 % (ref 21–52)
Lymphocytes Absolute: 3.2 10*3/uL (ref 0.9–3.6)
MCH: 27.7 PG (ref 24.0–34.0)
MCHC: 32.9 g/dL (ref 31.0–37.0)
MCV: 84.2 FL (ref 78.0–100.0)
MPV: 9.7 FL (ref 9.2–11.8)
Monocytes %: 11 % — ABNORMAL HIGH (ref 3–10)
Monocytes Absolute: 1.4 10*3/uL — ABNORMAL HIGH (ref 0.05–1.2)
Neutrophils %: 58 % (ref 40–73)
Neutrophils Absolute: 7.1 10*3/uL (ref 1.8–8.0)
Nucleated RBCs: 0 PER 100 WBC
Platelets: 258 10*3/uL (ref 135–420)
RBC: 4.37 M/uL (ref 4.20–5.30)
RDW: 15.1 % — ABNORMAL HIGH (ref 11.6–14.5)
WBC: 12.1 10*3/uL (ref 4.6–13.2)
nRBC: 0 10*3/uL (ref 0.00–0.01)

## 2023-04-20 LAB — BASIC METABOLIC PANEL
Anion Gap: 6 mmol/L (ref 3.0–18)
BUN/Creatinine Ratio: 12 (ref 12–20)
BUN: 9 MG/DL (ref 7.0–18)
CO2: 23 mmol/L (ref 21–32)
Calcium: 8 MG/DL — ABNORMAL LOW (ref 8.5–10.1)
Chloride: 111 mmol/L (ref 100–111)
Creatinine: 0.77 MG/DL (ref 0.6–1.3)
Est, Glom Filt Rate: >90 mL/min/{1.73_m2} (ref >60)
Glucose: 78 mg/dL (ref 74–99)
Potassium: 3.7 mmol/L (ref 3.5–5.5)
Sodium: 140 mmol/L (ref 136–145)

## 2023-04-20 LAB — LITHIUM LEVEL: Lithium: 0.4 MMOL/L — ABNORMAL LOW (ref 0.6–1.2)

## 2023-04-20 LAB — URINE DRUG SCREEN
Amphetamine, Urine: NEGATIVE
Barbiturates, Urine: NEGATIVE
Benzodiazepines, Urine: NEGATIVE
Cocaine, Urine: NEGATIVE
Methadone, Urine: NEGATIVE
Opiates, Urine: NEGATIVE
Phencyclidine, Urine: NEGATIVE
THC, TH-Cannabinol, Urine: NEGATIVE

## 2023-04-20 LAB — ETHANOL: Ethanol Lvl: 4 MG/DL — ABNORMAL HIGH (ref 0–3)

## 2023-04-20 LAB — ACETAMINOPHEN LEVEL: Acetaminophen Level: <2 ug/mL — ABNORMAL LOW (ref 10.0–30.0)

## 2023-04-20 LAB — PREGNANCY, URINE: Pregnancy, Urine: NEGATIVE

## 2023-04-20 LAB — SALICYLATE LEVEL: Salicylate Lvl: 3.5 MG/DL (ref 2.8–20.0)

## 2023-04-20 MED ORDER — LOPERAMIDE HCL 2 MG PO CAPS
2 MG | Freq: Four times a day (QID) | ORAL | Status: DC | PRN
Start: 2023-04-20 — End: 2023-04-20
  Administered 2023-04-20: 23:00:00 2 mg via ORAL

## 2023-04-20 MED FILL — LOPERAMIDE HCL 2 MG PO CAPS: 2 MG | ORAL | Qty: 1

## 2023-04-20 NOTE — ED Provider Notes (Signed)
EMERGENCY DEPARTMENT ENCOUNTER        Pt Name: [name]  MRN: [id number]  Birthdate [birth year]  Date of evaluation: 04/20/2023  Provider: Harlen Labs, DO   PCP: No primary care provider on file.  Note Started: 12:31 PM 04/20/23    History of Presenting Illness     Chief Complaint   Patient presents with    Suicidal       History From: Patient  None     [name] is a 47 y.o. female who presents for suicidal ideation that is acute on chronic since yesterday.  States she is starting to feel like she did right before she overdosed a few weeks ago.  States she has not done anything to hurt herself recently, however.  She does admit to drinking a lot last night and states she is been drinking almost daily for the last 1 to 2 weeks.  Marijuana use, but no other drug use reported.  States she does feel like she needs to be admitted to an inpatient psych facility again in order to remain safe.        Review of Systems     Review of Systems   Constitutional:  Negative for chills and fever.   Respiratory:  Negative for shortness of breath.    Cardiovascular:  Negative for chest pain.   Gastrointestinal:  Negative for vomiting.   Skin:  Negative for wound.   Hematological:  Does not bruise/bleed easily.   Psychiatric/Behavioral:  Negative for confusion, hallucinations and self-injury.         Positives and Pertinent negatives as per HPI.    Past History     Past Medical History:   Past Medical History:   Diagnosis Date    Depression     Hypertension     Pulmonary embolism (HCC)     Thyroid disease        Past Surgical History:  History reviewed. No pertinent surgical history.    Family History:  History reviewed. No pertinent family history.    Social History:   Social History     Tobacco Use    Smoking status: Every Day     Types: Cigarettes    Smokeless tobacco: Never   Substance Use Topics    Alcohol use: Defer    Drug use: Yes     Types: Marijuana Sheran Fava)       Allergies:  Allergies   Allergen Reactions     Ciprofloxacin Hives    Elemental Sulfur Hives       Medications:  Previous Medications    ATOGEPANT (QULIPTA) 60 MG TABS    Take by mouth    BUSPIRONE (BUSPAR) 15 MG TABLET    Take 15 mg by mouth 3 times daily    DULOXETINE (CYMBALTA) 60 MG EXTENDED RELEASE CAPSULE    Take 1 capsule by mouth daily    EZETIMIBE (ZETIA) 10 MG TABLET    Take 1 tablet by mouth daily    GABAPENTIN (NEURONTIN) 600 MG TABLET    Take 800 mg by mouth 3 times daily. Max Daily Amount: 2,400 mg    LEVOTHYROXINE (SYNTHROID) 175 MCG TABLET    Take 1 tablet by mouth Daily    LITHIUM 150 MG CAPSULE    Take 1 capsule by mouth once    LITHIUM 300 MG CAPSULE    Take 1 capsule by mouth daily    METHOCARBAMOL (ROBAXIN) 750 MG TABLET    Take 1 tablet by  mouth 3 times daily    MONTELUKAST (SINGULAIR) 10 MG TABLET    Take 1 tablet by mouth nightly    ROSUVASTATIN (CRESTOR) 40 MG TABLET    Take 1 tablet by mouth every evening         Physical Exam     Vitals:    04/20/23 0910   BP: 126/88   Pulse: 90   Resp: 20   Temp: 97.2 F (36.2 C)   SpO2: 99%   Weight: 99.8 kg (220 lb)   Height: 1.651 m (5\' 5" )       Physical Exam  Vitals and nursing note reviewed.   Constitutional:       Appearance: She is not ill-appearing or toxic-appearing.   HENT:      Head: Normocephalic and atraumatic.      Mouth/Throat:      Mouth: Mucous membranes are moist.   Eyes:      Extraocular Movements: Extraocular movements intact.      Conjunctiva/sclera: Conjunctivae normal.      Pupils: Pupils are equal, round, and reactive to light.   Cardiovascular:      Rate and Rhythm: Normal rate and regular rhythm.      Heart sounds: Normal heart sounds.   Pulmonary:      Effort: Pulmonary effort is normal.      Breath sounds: Normal breath sounds.   Abdominal:      General: Abdomen is flat.   Musculoskeletal:         General: No signs of injury.   Skin:     General: Skin is warm and dry.      Capillary Refill: Capillary refill takes less than 2 seconds.   Neurological:      General: No focal  deficit present.      Mental Status: She is alert and oriented to person, place, and time.   Psychiatric:         Mood and Affect: Mood is depressed. Affect is tearful.         Diagnostic Study Results     Labs:  Recent Results (from the past 12 hour(s))   CBC with Auto Differential    Collection Time: 04/20/23  9:30 AM   Result Value Ref Range    WBC 12.1 4.6 - 13.2 K/uL    RBC 4.37 4.20 - 5.30 M/uL    Hemoglobin 12.1 12.0 - 16.0 g/dL    Hematocrit 19.1 47.8 - 45.0 %    MCV 84.2 78.0 - 100.0 FL    MCH 27.7 24.0 - 34.0 PG    MCHC 32.9 31.0 - 37.0 g/dL    RDW 29.5 (H) 62.1 - 14.5 %    Platelets 258 135 - 420 K/uL    MPV 9.7 9.2 - 11.8 FL    Nucleated RBCs 0.0 0 PER 100 WBC    nRBC 0.00 0.00 - 0.01 K/uL    Neutrophils % 58 40 - 73 %    Lymphocytes % 26 21 - 52 %    Monocytes % 11 (H) 3 - 10 %    Eosinophils % 3 0 - 5 %    Basophils % 1 0 - 2 %    Immature Granulocytes % 0 0.0 - 0.5 %    Neutrophils Absolute 7.1 1.8 - 8.0 K/UL    Lymphocytes Absolute 3.2 0.9 - 3.6 K/UL    Monocytes Absolute 1.4 (H) 0.05 - 1.2 K/UL    Eosinophils Absolute 0.4 0.0 -  0.4 K/UL    Basophils Absolute 0.1 0.0 - 0.1 K/UL    Immature Granulocytes Absolute 0.1 (H) 0.00 - 0.04 K/UL    Differential Type AUTOMATED     BMP    Collection Time: 04/20/23  9:30 AM   Result Value Ref Range    Sodium 140 136 - 145 mmol/L    Potassium 3.7 3.5 - 5.5 mmol/L    Chloride 111 100 - 111 mmol/L    CO2 23 21 - 32 mmol/L    Anion Gap 6 3.0 - 18 mmol/L    Glucose 78 74 - 99 mg/dL    BUN 9 7.0 - 18 MG/DL    Creatinine 1.61 0.6 - 1.3 MG/DL    BUN/Creatinine Ratio 12 12 - 20      Est, Glom Filt Rate >90 >60 ml/min/1.65m2    Calcium 8.0 (L) 8.5 - 10.1 MG/DL   ETOH    Collection Time: 04/20/23  9:30 AM   Result Value Ref Range    Ethanol Lvl 4 (H) 0 - 3 MG/DL   Acetaminophen Level    Collection Time: 04/20/23  9:30 AM   Result Value Ref Range    Acetaminophen Level <2 (L) 10.0 - 30.0 ug/mL   Salicylate    Collection Time: 04/20/23  9:30 AM   Result Value Ref Range     Salicylate Lvl 3.5 2.8 - 20.0 MG/DL   Urine Preg (Lab)    Collection Time: 04/20/23  9:31 AM   Result Value Ref Range    Pregnancy, Urine Negative NEG     Urine Drug Screen    Collection Time: 04/20/23  9:31 AM   Result Value Ref Range    Benzodiazepines, Urine Negative NEG      Barbiturates, Urine Negative NEG      THC, TH-Cannabinol, Urine Negative NEG      Opiates, Urine Negative NEG      Phencyclidine, Urine Negative NEG      Cocaine, Urine Negative NEG      Amphetamine, Urine Negative NEG      Methadone, Urine Negative NEG      Comments: (NOTE)       Labs Reviewed   CBC WITH AUTO DIFFERENTIAL - Abnormal; Notable for the following components:       Result Value    RDW 15.1 (*)     Monocytes % 11 (*)     Monocytes Absolute 1.4 (*)     Immature Granulocytes Absolute 0.1 (*)     All other components within normal limits   BASIC METABOLIC PANEL - Abnormal; Notable for the following components:    Calcium 8.0 (*)     All other components within normal limits   ETHANOL - Abnormal; Notable for the following components:    Ethanol Lvl 4 (*)     All other components within normal limits   ACETAMINOPHEN LEVEL - Abnormal; Notable for the following components:    Acetaminophen Level <2 (*)     All other components within normal limits   PREGNANCY, URINE   URINE DRUG SCREEN   SALICYLATE LEVEL   LITHIUM LEVEL           Medical Decision Making   I am the first provider for this patient.    I reviewed the vital signs, available nursing notes, past medical history, past surgical history, family history, and social history.      MDM:   DDX includes but is not limited to: Suicidal  ideation, substance abuse, less likely acute psychosis, electrolyte abnormalities, others.      ED Course:   ED Course as of 04/20/23 1231   Mon Apr 20, 2023   1108 Screening labs unremarkable.  Added on lithium level.  Patient pending placement to inpatient psych facility. [AG]   1231 Patient is medically cleared. [AG]      ED Course User Index  [AG]  Harlen Labs, DO           Procedures:  Procedures      Social Determinants of Health: Substance use       Supplemental Historians include: N/A       Documentation/Prior Results Review:  Old medical records.  Nursing notes.    Diagnosis and Disposition     CLINICAL IMPRESSION:  1. Suicidal ideation    2. Alcohol use         Medication List        ASK your doctor about these medications      busPIRone 15 MG tablet  Commonly known as: BUSPAR     DULoxetine 60 MG extended release capsule  Commonly known as: CYMBALTA     ezetimibe 10 MG tablet  Commonly known as: ZETIA     gabapentin 600 MG tablet  Commonly known as: NEURONTIN     levothyroxine 175 MCG tablet  Commonly known as: SYNTHROID     * lithium 150 MG capsule     * lithium 300 MG capsule     methocarbamol 750 MG tablet  Commonly known as: ROBAXIN     montelukast 10 MG tablet  Commonly known as: SINGULAIR     Qulipta 60 MG Tabs  Generic drug: Atogepant     rosuvastatin 40 MG tablet  Commonly known as: CRESTOR           * This list has 2 medication(s) that are the same as other medications prescribed for you. Read the directions carefully, and ask your doctor or other care provider to review them with you.                  PATIENT REFERRED TO:  No follow-up provider specified.    Disposition:   DISPOSITION Behavioral Health Hold 04/20/2023 10:19:36 AM      Admit Note: Pt is being admitted by psych. The results of their tests and reason(s) for their admission have been discussed with pt and/or available family. They convey agreement and understanding for the need to be admitted and for the admission diagnosis.    Patient condition at time of disposition: Stable    Animal nutritionist     Please note that this dictation was completed with Dragon, the computer voice recognition software.  Quite often unanticipated grammatical, syntax, homophones, and other interpretive errors are inadvertently transcribed by the computer software.  Please disregard these errors.   Please excuse any errors that have escaped final proofreading.      Harlen Labs DO  12:31 PM        Harlen Labs, DO  04/20/23 1232

## 2023-04-20 NOTE — ED Triage Notes (Signed)
Pt arrived with all of her personal  belongings complaining of suicidal thoughts. Pt stats she had just gotten out [facility] a week ago and feels like she is going down the same track as before, Pt stated she overdosed on pills back in February and has the same thoughts. Pt also states she has no support or family or anywhere to go. Pt is calm, tearful, sad and cooperative. Pt has been scanned, placed in paper scrubs with socks. Sitter 1:1 is being done and room is emptied out and belongings are labeled with sticker.

## 2023-04-20 NOTE — ED Notes (Signed)
Spoke with Tim from tele-psych.    Dr. Renaldo Harrison needs to medically clear patient and finish her note.
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
## 2023-04-20 NOTE — ED Notes (Signed)
RN relayed message to Provider from tele-psych

## 2023-04-20 NOTE — ED Notes (Signed)
RN called crisis and let them know that the Provider finished her note and medically cleared the patient. Crisis relayed that they were waiting on one lab to result.

## 2023-04-20 NOTE — Other (Signed)
Crisis note:    Patient is a voluntary admit from [facility] ER. Patient reporting suicidal ideations with inability to contract for safety outside of a hospital setting.    Dr. Fortino Sic accepted patient, admit to Dr. Thomasene Mohair service.  Adult 1 room 123-01  Report number is [phone]

## 2023-04-20 NOTE — Progress Notes (Signed)
[facility]  Admission Note     Admission Type: Voluntary       Reason for admission: SI, homelessness       PATIENT STRENGTHS: wants to receive treatment for substance abuse       Patient Strengths and Limitations: substance abuse, homelessness          Addictive Behavior: substance abuse, etoh       Medical Problems:   Past Medical History:   Diagnosis Date    Depression     Hypertension     Pulmonary embolism (HCC)     Thyroid disease        Status EXAM:  Mental Status and Behavioral Exam  Normal: No  Level of Assistance: Independent/Self  Facial Expression: Brightened  Affect: Congruent  Level of Consciousness: Alert  Frequency of Checks: 4 times per hour, close  Mood:Normal: Yes  Motor Activity:Normal: Yes  Eye Contact: Fair  Observed Behavior: Cooperative  Sexual Misconduct History: Current - no  Preception: Orient to person, Orient to time, Orient to place, Orient to situation  Attention:Normal: Yes  Thought Processes: Unremarkable  Thought Content:Normal: Yes  Depression Symptoms: No problems reported or observed.  Anxiety Symptoms: No problems reported or observed.  Mania Symptoms: No problems reported or observed.  Hallucinations: None  Delusions: No  Memory:Normal: Yes  Insight and Judgment: No  Insight and Judgment: Poor judgment, Poor insight    Pt admitted with followings belongings:  Dental Appliances: None  Vision - Corrective Lenses: None  Hearing Aid: None  Jewelry: None, Body Piercing  Body Piercings Removed: No  Clothing: Other (Comment) (laudry bag ful of clothes)  Other Valuables: Wallet     Valuables sent home with n/a. Valuables placed in safe in security envelope, number:  [id number]. Patient's home medications were lcoked up.  Patient oriented to surroundings and program expectations and copy of patient rights given. Received admission packet:  yes.  Consents reviewed, signed yes. Refused n/a. Patient verbalize understanding:  yes.    Patient education on precautions: yes                    Rosine Beat, RN

## 2023-04-20 NOTE — Plan of Care (Signed)
Problem: Discharge Planning  Goal: Discharge to home or other facility with appropriate resources  Outcome: Progressing     Problem: Self Harm/Suicidality  Goal: Will have no self-injury during hospital stay  Description: INTERVENTIONS:  1.  Ensure constant observer at bedside with Q15M safety checks  2.  Maintain a safe environment  3.  Secure patient belongings  4.  Ensure family/visitors adhere to safety recommendations  5.  Ensure safety tray has been added to patient's diet order  6.  Every shift and PRN: Re-assess suicidal risk via Frequent Screener    04/20/2023 2252 by Rosine Beat, RN  Outcome: Progressing  04/20/2023 2150 by Rosine Beat, RN  Outcome: Progressing     Problem: Depression  Goal: Will be euthymic at discharge  Description: INTERVENTIONS:  1. Administer medication as ordered  2. Provide emotional support via 1:1 interaction with staff  3. Encourage involvement in milieu/groups/activities  4. Monitor for social isolation  04/20/2023 2252 by Rosine Beat, RN  Outcome: Progressing  04/20/2023 2150 by Rosine Beat, RN  Outcome: Progressing     Problem: Anxiety  Goal: Will report anxiety at manageable levels  Description: INTERVENTIONS:  1. Administer medication as ordered  2. Teach and rehearse alternative coping skills  3. Provide emotional support with 1:1 interaction with staff  04/20/2023 2252 by Rosine Beat, RN  Outcome: Progressing  04/20/2023 2150 by Rosine Beat, RN  Outcome: Progressing     Problem: Drug Abuse/Detox  Goal: Will have no detox symptoms and will verbalize plan for changing drug-related behavior  Description: INTERVENTIONS:  1. Administer medication as ordered  2. Monitor physical status  3. Provide emotional support with 1:1 interaction with staff  4. Encourage  recovery focused treatment   04/20/2023 2252 by Rosine Beat, RN  Outcome: Progressing  04/20/2023 2150 by Rosine Beat, RN  Outcome: Progressing     Problem: Sleep Disturbance  Goal: Will exhibit normal  sleeping pattern  Description: INTERVENTIONS:  1. Administer medication as ordered  2. Decrease environmental stimuli, including noise, as appropriate  3. Discourage social isolation and naps during the day  04/20/2023 2252 by Rosine Beat, RN  Outcome: Progressing  04/20/2023 2150 by Rosine Beat, RN  Outcome: Progressing     Received pt to the unit alert and oriented x4. Denies SI/HI and AVH at the present time. Reports increased anxiety and depression due to being homeless and unemployed. States that she was recently admitted to the [facility] but it didn't workout. Report intentional ingestion of pills in Feb as suicide attempt. Euthymic mood, affect is congruent. Belongings searched, valuables sent to security. Pt given Trazdone as ordered for sleep. Q15 minute checks ongoing. Staff will continue to maintain a safe and therapeutic environment. RN will initiate, develop, implement, review or revise treatment plan.

## 2023-04-20 NOTE — ED Notes (Addendum)
Patient changed in paper scrubs w/red gripper socks on.

## 2023-04-20 NOTE — ED Notes (Signed)
Pt ambulatory to restroom with no complaints. Sitter by restroom at this time.

## 2023-04-20 NOTE — ED Notes (Signed)
Belongings at nurse's station, 7 Labeled bags (including 2 suitcases, 1 mesh bag, 2 ED belongings bags [purse in bag], 1 clear bag [cell phone on side pocket of this bag pink phone case], 1 floral fabric bag).

## 2023-04-20 NOTE — ED Notes (Signed)
Tele Psych on screen in patient room.

## 2023-04-20 NOTE — ED Notes (Addendum)
RN called report Stark Klein, RN

## 2023-04-20 NOTE — ED Notes (Signed)
Sitter at bedside.

## 2023-04-20 NOTE — ED Notes (Signed)
Called [facility] spoke to [place]. Provided patient information. ETA 5pm

## 2023-04-20 NOTE — ED Notes (Addendum)
Security in room to wand patient. 

## 2023-04-20 NOTE — Other (Signed)
Chief Complaint   Patient presents with    Suicidal       Patient was evaluated by Tele-Psych who recommends inpatient psychiatric treatment for suicidal ideations without a plan     Patient is voluntary for inpatient treatment.    Past Medical History:   Diagnosis Date    Depression     Hypertension     Pulmonary embolism (HCC)     Thyroid disease        Prior to Visit Medications    Medication Sig Taking? Authorizing Provider   levothyroxine (SYNTHROID) 175 MCG tablet Take 1 tablet by mouth Daily Yes [provider]   Atogepant (QULIPTA) 60 MG TABS Take by mouth Yes [provider]   lithium 150 MG capsule Take 1 capsule by mouth once Yes [provider]   lithium 300 MG capsule Take 1 capsule by mouth daily Yes [provider]   montelukast (SINGULAIR) 10 MG tablet Take 1 tablet by mouth nightly Yes [provider]   rosuvastatin (CRESTOR) 40 MG tablet Take 1 tablet by mouth every evening Yes [provider]   ezetimibe (ZETIA) 10 MG tablet Take 1 tablet by mouth daily Yes [provider]   DULoxetine (CYMBALTA) 60 MG extended release capsule Take 1 capsule by mouth daily Yes [provider]   gabapentin (NEURONTIN) 600 MG tablet Take 800 mg by mouth 3 times daily. Max Daily Amount: 2,400 mg Yes [provider]   methocarbamol (ROBAXIN) 750 MG tablet Take 1 tablet by mouth 3 times daily Yes [provider]   busPIRone (BUSPAR) 15 MG tablet Take 15 mg by mouth 3 times daily Yes [provider]         The following labs and vitals were reviewed with on-call psychiatrist.    CMP, CBC, ETOH, HCG, UDS, and Lithium level    Vitals:    04/20/23 0910   BP: 126/88   Pulse: 90   Resp: 20   Temp: 97.2 F (36.2 C)   SpO2: 99%         Writer met with patient to assess the following    Ambulation - Patient reports ability to ambulate without difficulty or the use of any assistive devices.    ADLs - Patient able to perform own  ADLs without assistance.    DME - Patient requires none.    In consultation with on call provider Vizcaino MD. Patient has been accepted to [facility]..  Admit to Adult 1 room 123-01, report to unit RN Stark Klein.

## 2023-04-21 ENCOUNTER — Inpatient Hospital Stay
Admit: 2023-04-21 | Discharge: 2023-04-27 | Disposition: A | Discharge status: Home / Self Care | Payer: MEDICAID | Source: Other Acute Inpatient Hospital | Attending: Psychiatry | Admitting: Psychiatry

## 2023-04-21 DIAGNOSIS — M797 Fibromyalgia: Secondary | ICD-10-CM

## 2023-04-21 DIAGNOSIS — F3181 Bipolar II disorder: Principal | ICD-10-CM

## 2023-04-21 MED ORDER — LORAZEPAM 1 MG PO TABS
1 | ORAL | Status: DC | PRN
Start: 2023-04-21 — End: 2023-04-21

## 2023-04-21 MED ORDER — MONTELUKAST SODIUM 10 MG PO TABS
10 | Freq: Every evening | ORAL | Status: DC
Start: 2023-04-21 — End: 2023-04-27
  Administered 2023-04-22 – 2023-04-27 (×6): 10 mg via ORAL

## 2023-04-21 MED ORDER — HYDROXYZINE HCL 50 MG PO TABS
50 MG | Freq: Four times a day (QID) | ORAL | Status: DC | PRN
Start: 2023-04-21 — End: 2023-04-27
  Administered 2023-04-21 – 2023-04-27 (×6): 25 mg via ORAL

## 2023-04-21 MED ORDER — BENZTROPINE MESYLATE 1 MG PO TABS
1 | Freq: Four times a day (QID) | ORAL | Status: DC | PRN
Start: 2023-04-21 — End: 2023-04-21

## 2023-04-21 MED ORDER — SODIUM CHLORIDE 0.9 % IV SOLN
0.9 | INTRAVENOUS | Status: DC | PRN
Start: 2023-04-21 — End: 2023-04-21

## 2023-04-21 MED ORDER — NORMAL SALINE FLUSH 0.9 % IV SOLN
0.9 | INTRAVENOUS | Status: DC | PRN
Start: 2023-04-21 — End: 2023-04-21

## 2023-04-21 MED ORDER — LITHIUM CARBONATE 300 MG PO CAPS
300 | Freq: Every evening | ORAL | Status: DC
Start: 2023-04-21 — End: 2023-04-27
  Administered 2023-04-22 – 2023-04-27 (×6): 600 mg via ORAL

## 2023-04-21 MED ORDER — LEVOTHYROXINE SODIUM 25 MCG PO TABS
25 MCG | Freq: Every day | ORAL | Status: DC
Start: 2023-04-21 — End: 2023-04-27
  Administered 2023-04-21 – 2023-04-27 (×7): 175 ug via ORAL

## 2023-04-21 MED ORDER — NALTREXONE HCL 50 MG PO TABS
50 | Freq: Every day | ORAL | Status: DC
Start: 2023-04-21 — End: 2023-04-27
  Administered 2023-04-22 – 2023-04-27 (×6): 50 mg via ORAL

## 2023-04-21 MED ORDER — NORMAL SALINE FLUSH 0.9 % IV SOLN
0.9 | Freq: Two times a day (BID) | INTRAVENOUS | Status: DC
Start: 2023-04-21 — End: 2023-04-21

## 2023-04-21 MED ORDER — LORAZEPAM 2 MG/ML IJ SOLN
2 | INTRAMUSCULAR | Status: DC | PRN
Start: 2023-04-21 — End: 2023-04-21

## 2023-04-21 MED ORDER — DULOXETINE HCL 30 MG PO CPEP
30 | Freq: Every day | ORAL | Status: DC
Start: 2023-04-21 — End: 2023-04-21
  Administered 2023-04-21: 13:00:00 60 mg via ORAL

## 2023-04-21 MED ORDER — ACETAMINOPHEN 325 MG PO TABS
325 | ORAL | Status: DC | PRN
Start: 2023-04-21 — End: 2023-04-27
  Administered 2023-04-26: 650 mg via ORAL

## 2023-04-21 MED ORDER — ALUM & MAG HYDROXIDE-SIMETH 200-200-20 MG/5ML PO SUSP
200-200-20 | Freq: Four times a day (QID) | ORAL | Status: DC | PRN
Start: 2023-04-21 — End: 2023-04-27

## 2023-04-21 MED ORDER — GABAPENTIN 300 MG PO CAPS
300 | Freq: Three times a day (TID) | ORAL | Status: DC
Start: 2023-04-21 — End: 2023-04-27
  Administered 2023-04-21 – 2023-04-27 (×18): 300 mg via ORAL

## 2023-04-21 MED ORDER — FOLIC ACID 1 MG PO TABS
1 | Freq: Every day | ORAL | Status: DC
Start: 2023-04-21 — End: 2023-04-27
  Administered 2023-04-21 – 2023-04-27 (×7): 1 mg via ORAL

## 2023-04-21 MED ORDER — DULOXETINE HCL 30 MG PO CPEP
30 | Freq: Two times a day (BID) | ORAL | Status: DC
Start: 2023-04-21 — End: 2023-04-27
  Administered 2023-04-22 – 2023-04-27 (×12): 60 mg via ORAL

## 2023-04-21 MED ORDER — TRAZODONE HCL 50 MG PO TABS
50 | Freq: Every evening | ORAL | Status: DC | PRN
Start: 2023-04-21 — End: 2023-04-27
  Administered 2023-04-21 – 2023-04-27 (×7): 50 mg via ORAL

## 2023-04-21 MED ORDER — BENZTROPINE MESYLATE 1 MG/ML IJ SOLN
1 | Freq: Four times a day (QID) | INTRAMUSCULAR | Status: DC | PRN
Start: 2023-04-21 — End: 2023-04-21

## 2023-04-21 MED ORDER — HALOPERIDOL LACTATE 5 MG/ML IJ SOLN
5 | Freq: Four times a day (QID) | INTRAMUSCULAR | Status: DC | PRN
Start: 2023-04-21 — End: 2023-04-21

## 2023-04-21 MED ORDER — METHOCARBAMOL 500 MG PO TABS
500 | Freq: Three times a day (TID) | ORAL | Status: DC
Start: 2023-04-21 — End: 2023-04-27
  Administered 2023-04-21 – 2023-04-27 (×16): 750 mg via ORAL

## 2023-04-21 MED ORDER — BUSPIRONE HCL 10 MG PO TABS
10 MG | Freq: Three times a day (TID) | ORAL | Status: DC
Start: 2023-04-21 — End: 2023-04-27
  Administered 2023-04-21 – 2023-04-27 (×18): 15 mg via ORAL

## 2023-04-21 MED ORDER — LITHIUM CARBONATE 300 MG PO CAPS
300 | Freq: Every day | ORAL | Status: DC
Start: 2023-04-21 — End: 2023-04-27
  Administered 2023-04-21 – 2023-04-27 (×7): 300 mg via ORAL

## 2023-04-21 MED ORDER — GABAPENTIN 300 MG PO CAPS
300 | Freq: Every day | ORAL | Status: DC
Start: 2023-04-21 — End: 2023-04-21
  Administered 2023-04-21: 13:00:00 300 mg via ORAL

## 2023-04-21 MED ORDER — HALOPERIDOL 5 MG PO TABS
5 | Freq: Four times a day (QID) | ORAL | Status: DC | PRN
Start: 2023-04-21 — End: 2023-04-21

## 2023-04-21 MED ORDER — THIAMINE MONONITRATE 100 MG PO TABS
100 | Freq: Every day | ORAL | Status: DC
Start: 2023-04-21 — End: 2023-04-27
  Administered 2023-04-21 – 2023-04-27 (×7): 100 mg via ORAL

## 2023-04-21 MED ORDER — RIVAROXABAN 10 MG PO TABS
10 | Freq: Every day | ORAL | Status: DC
Start: 2023-04-21 — End: 2023-04-27
  Administered 2023-04-21 – 2023-04-26 (×6): 10 mg via ORAL

## 2023-04-21 MED FILL — XARELTO 10 MG PO TABS: 10 MG | ORAL | Qty: 1

## 2023-04-21 MED FILL — THIAMINE MONONITRATE 100 MG PO TABS: 100 MG | ORAL | Qty: 1

## 2023-04-21 MED FILL — METHOCARBAMOL 500 MG PO TABS: 500 MG | ORAL | Qty: 2

## 2023-04-21 MED FILL — MONTELUKAST SODIUM 10 MG PO TABS: 10 MG | ORAL | Qty: 1

## 2023-04-21 MED FILL — LITHIUM CARBONATE 300 MG PO CAPS: 300 MG | ORAL | Qty: 1

## 2023-04-21 MED FILL — GABAPENTIN 300 MG PO CAPS: 300 MG | ORAL | Qty: 1

## 2023-04-21 MED FILL — LEVOTHYROXINE SODIUM 25 MCG PO TABS: 25 MCG | ORAL | Qty: 1

## 2023-04-21 MED FILL — NALTREXONE HCL 50 MG PO TABS: 50 MG | ORAL | Qty: 1

## 2023-04-21 MED FILL — HYDROXYZINE HCL 50 MG PO TABS: 50 MG | ORAL | Qty: 1

## 2023-04-21 MED FILL — FOLIC ACID 1 MG PO TABS: 1 MG | ORAL | Qty: 1

## 2023-04-21 MED FILL — TRAZODONE HCL 50 MG PO TABS: 50 MG | ORAL | Qty: 1

## 2023-04-21 MED FILL — LITHIUM CARBONATE 300 MG PO CAPS: 300 MG | ORAL | Qty: 2

## 2023-04-21 MED FILL — DULOXETINE HCL 30 MG PO CPEP: 30 MG | ORAL | Qty: 2

## 2023-04-21 MED FILL — BD POSIFLUSH 0.9 % IV SOLN: 0.9 % | INTRAVENOUS | Qty: 40

## 2023-04-21 MED FILL — BUSPIRONE HCL 5 MG PO TABS: 5 MG | ORAL | Qty: 1

## 2023-04-21 NOTE — Behavioral Health Treatment Team (Signed)
M.H.T. Note: The above pt was seen by history and physical doctor during this morning portion of the shift he was accompanied by staff (person writing).

## 2023-04-21 NOTE — H&P (Signed)
[facility]  FAMILY MEDICINE CONSULT NOTE FOR BEHAVIORAL HEALTH UNIT    Patient:    [name] , 47 y.o. female   Room/Bed:  129/01  Admission Date:   04/20/2023  Code status:  Full Code      Reason for Consult: Medication Management   Psychiatry Attending Requesting Consult: Dr. Freddy Finner    ASSESSMENT:  [name] is a 47 y.o. female w/ a PMH of Alcohol/substance abuse, HLD, hypothyroidism, B/L PE on xarelto presenting for SI who is now admitted to the [facility] Behavioral Health Unit.    Nurse Chaperone during History and Physical:     PLAN:    SI  -Trazodone   -Management per inpatient psychiatry    Alcohol use  - last drink was yesterday night  - ETOH lvl: 4  - on CIWA; no lorazepam given to date     Hypothyroidism  - on 175 levothyroxine; denies missing doses     Hx B/l Pulmonary embolism   - happened about 3 years ago   - on daily xarelto. Prescribed by PCP at [facility]?  - unable to confirm in EMR; Pharmacy reported insurance reports of 10mg  daily xarelto   - will start xarelto 10mg  daily     Fibromyalgia   - pain mostly in back   - methocarbamol 750 TID  - duloxetine 60  - gabapentin 800 TID; will start with low dose during admission        Global  Diet: Regular diet   Mobility: per protocol     Thank you for this consult.         SUBJECTIVE:   Dr. Freddy Finner has consulted Family Medicine to evaluate [name]  in the Emergency Department. She  is a 47 y.o. female w/ PMHx of Alcohol/substance abuse, HLD, hypothyroidism, B/L PE on xarelto presenting for SI who is now admitted to the [facility] Behavioral Health Unit.     ED Course:  -Vitals: 115/82  -Labs: CBC, BMP reassuring  -Imaging: none  -EKG:   -Meds:   -IVF:   -Procedures:   -Consults:Fam Med     CURRENT MEDICATIONS:  Current Facility-Administered Medications   Medication Dose Route Frequency Provider Last Rate Last Admin    benztropine (COGENTIN) tablet 1 mg  1 mg Oral Q6H PRN Vizcaino, Federico, MD        acetaminophen (TYLENOL)  tablet 650 mg  650 mg Oral Q4H PRN Vizcaino, Federico, MD        aluminum & magnesium hydroxide-simethicone (MAALOX) 200-200-20 MG/5ML suspension 30 mL  30 mL Oral Q6H PRN Vizcaino, Federico, MD        hydrOXYzine HCl (ATARAX) tablet 25 mg  25 mg Oral Q6H PRN Vizcaino, Federico, MD        haloperidol (HALDOL) tablet 5 mg  5 mg Oral Q6H PRN Vizcaino, Federico, MD        Or    haloperidol lactate (HALDOL) injection 5 mg  5 mg IntraMUSCular Q6H PRN Vizcaino, Federico, MD        benztropine mesylate (COGENTIN) injection 1 mg  1 mg IntraMUSCular Q6H PRN Vizcaino, Federico, MD        traZODone (DESYREL) tablet 50 mg  50 mg Oral Nightly PRN Vizcaino, Federico, MD   50 mg at 04/20/23 2052       ROS (positive findings are in BOLD; negative findings are in regular font)  Constitutional: fevers, chills, appetite changes, weight changes, fatigue  HEENT: changes in vision,  changes in hearing, sore throat, dysphagia  Cardiovascular: chest pain, palpitations, PND, orthopnea, edema  Pulmonary: SOB, cough, sputum production, wheezing, chest tightness  Gastrointestinal: abdominal pain, nausea/vomiting, diarrhea, constipation, melena, hematochezia  Genitourinary: dysuria, hesitation, dribbling, urgency, hematuria  Musculoskeletal: arthralgias, generalized diffuse back pain   Skin: rash, itching  Neurological: sensory changes, motor changes, headache  Psychiatric: mood changes  Endocrine: heat/cold intolerance  Heme: easy bruising/easy bleeding, LAD    HOME MEDICATIONS:   Current Outpatient Medications   Medication Instructions    Atogepant (QULIPTA) 60 MG TABS Oral    busPIRone (BUSPAR) 15 mg, Oral, 3 TIMES DAILY    DULoxetine (CYMBALTA) 60 mg, Oral, DAILY    ezetimibe (ZETIA) 10 mg, Oral, DAILY    gabapentin (NEURONTIN) 800 mg, Oral, 3 TIMES DAILY    levothyroxine (SYNTHROID) 175 mcg, Oral, DAILY    lithium 150 mg, Oral, ONCE    lithium 300 mg, Oral, DAILY    methocarbamol (ROBAXIN) 750 mg, Oral, 3 TIMES DAILY    montelukast  (SINGULAIR) 10 mg, Oral, NIGHTLY    rosuvastatin (CRESTOR) 40 mg, Oral, EVERY EVENING       MEDICAL HISTORY:  Past Medical History:   Diagnosis Date    Depression     Hypertension     Pulmonary embolism (HCC)     Thyroid disease        No past surgical history on file.    No family history on file.    Social History     Socioeconomic History    Marital status: Unknown   Tobacco Use    Smoking status: Every Day     Types: Cigarettes    Smokeless tobacco: Never   Substance and Sexual Activity    Alcohol use: Defer    Drug use: Yes     Types: Marijuana Sheran Fava)     Social Determinants of Health     Food Insecurity: Food Insecurity Present (04/20/2023)    Hunger Vital Sign     Worried About Running Out of Food in the Last Year: Often true     Ran Out of Food in the Last Year: Often true   Transportation Needs: Unmet Transportation Needs (04/20/2023)    PRAPARE - Therapist, art (Medical): Yes     Lack of Transportation (Non-Medical): Yes    Social Connections ([facility] HRSN)   Housing Stability: High Risk (04/20/2023)    Housing Stability Vital Sign     Unable to Pay for Housing in the Last Year: Yes     Number of Places Lived in the Last Year: 4     Unstable Housing in the Last Year: Yes       Allergies   Allergen Reactions    Ciprofloxacin Hives    Elemental Sulfur Hives           OBJECTIVE:   VITALS  Patient Vitals for the past 24 hrs:   Temp Pulse Resp BP   04/20/23 2056 97.6 F (36.4 C) 77 18 127/84       PHYSICAL EXAM  General: well developed and well nourished.  HEENT: NCAT, EOM intact; oral mucosa well perfused, oropharynx clear  Neck: supple, normal ROM  CVS: regular rate and rhythm, S1, S2 normal, no murmur, click, rub or gallop  Lungs: chest clear, no wheezing, rales, normal symmetric air entry    Abdomen: Soft, non-distended, non-TTP, no organomegaly, no masses  Ext: No calf tenderness, peripheral pulses present, no significant edema.  Skin: warm, dry, intact, no significant  rashes/petechia/ecchymosis appreciated  Neuro: No focal neurologic deficits or gross abnormalities  Psych: A&Ox3, appropriate mood and affect    LABS, IMAGING, AND DIAGNOSTIC STUDIES  Recent Results (from the past 24 hour(s))   CBC with Auto Differential    Collection Time: 04/20/23  9:30 AM   Result Value Ref Range    WBC 12.1 4.6 - 13.2 K/uL    RBC 4.37 4.20 - 5.30 M/uL    Hemoglobin 12.1 12.0 - 16.0 g/dL    Hematocrit 04.5 40.9 - 45.0 %    MCV 84.2 78.0 - 100.0 FL    MCH 27.7 24.0 - 34.0 PG    MCHC 32.9 31.0 - 37.0 g/dL    RDW 81.1 (H) 91.4 - 14.5 %    Platelets 258 135 - 420 K/uL    MPV 9.7 9.2 - 11.8 FL    Nucleated RBCs 0.0 0 PER 100 WBC    nRBC 0.00 0.00 - 0.01 K/uL    Neutrophils % 58 40 - 73 %    Lymphocytes % 26 21 - 52 %    Monocytes % 11 (H) 3 - 10 %    Eosinophils % 3 0 - 5 %    Basophils % 1 0 - 2 %    Immature Granulocytes % 0 0.0 - 0.5 %    Neutrophils Absolute 7.1 1.8 - 8.0 K/UL    Lymphocytes Absolute 3.2 0.9 - 3.6 K/UL    Monocytes Absolute 1.4 (H) 0.05 - 1.2 K/UL    Eosinophils Absolute 0.4 0.0 - 0.4 K/UL    Basophils Absolute 0.1 0.0 - 0.1 K/UL    Immature Granulocytes Absolute 0.1 (H) 0.00 - 0.04 K/UL    Differential Type AUTOMATED     BMP    Collection Time: 04/20/23  9:30 AM   Result Value Ref Range    Sodium 140 136 - 145 mmol/L    Potassium 3.7 3.5 - 5.5 mmol/L    Chloride 111 100 - 111 mmol/L    CO2 23 21 - 32 mmol/L    Anion Gap 6 3.0 - 18 mmol/L    Glucose 78 74 - 99 mg/dL    BUN 9 7.0 - 18 MG/DL    Creatinine 7.82 0.6 - 1.3 MG/DL    BUN/Creatinine Ratio 12 12 - 20      Est, Glom Filt Rate >90 >60 ml/min/1.52m2    Calcium 8.0 (L) 8.5 - 10.1 MG/DL   ETOH    Collection Time: 04/20/23  9:30 AM   Result Value Ref Range    Ethanol Lvl 4 (H) 0 - 3 MG/DL   Acetaminophen Level    Collection Time: 04/20/23  9:30 AM   Result Value Ref Range    Acetaminophen Level <2 (L) 10.0 - 30.0 ug/mL   Salicylate    Collection Time: 04/20/23  9:30 AM   Result Value Ref Range    Salicylate Lvl 3.5 2.8 - 20.0  MG/DL   Lithium Level    Collection Time: 04/20/23  9:30 AM   Result Value Ref Range    Lithium 0.40 (L) 0.6 - 1.2 MMOL/L   Urine Preg (Lab)    Collection Time: 04/20/23  9:31 AM   Result Value Ref Range    Pregnancy, Urine Negative NEG     Urine Drug Screen    Collection Time: 04/20/23  9:31 AM   Result Value Ref Range    Benzodiazepines, Urine Negative NEG  Barbiturates, Urine Negative NEG      THC, TH-Cannabinol, Urine Negative NEG      Opiates, Urine Negative NEG      Phencyclidine, Urine Negative NEG      Cocaine, Urine Negative NEG      Amphetamine, Urine Negative NEG      Methadone, Urine Negative NEG      Comments: (NOTE)        ================================================================  Assessment and recommendations will be discussed with my attending, who will provide final recommendations.       Park Breed, MD PGY-2  [facility]  April 21, 2023 7:39 AM

## 2023-04-21 NOTE — Group Note (Signed)
Group Therapy Note    Date: 04/21/2023    Group Start Time: 0930  Group End Time: 1025  Group Topic: Music Therapy      Desmond Dike        Group Therapy Note    Attendees: 7/12    Group Focus: Music therapy group consisted of lyric analysis of two songs previously suggested by a group member to promote self-awareness. The group discussed themes related to emotional suppression, vulnerability to express emotion, mental health particularly as it relates to men, alcohol use as a coping mechanism and its consequences, and stress management. The group closed with a music-assisted relaxation and stretching for stress management.       Notes:  Patient did not attend group, appearing to be sleeping when invited to group.      Discipline Responsible: Social Worker/Counselor      Signature:  Desmond Dike, MT-BC, [facility]  Board-Certified Music Barrister's clerk
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
## 2023-04-21 NOTE — Group Note (Signed)
Group Therapy Note    Date: 04/21/2023    Group Start Time: 1500  Group End Time: 1545  Group Topic: Recreational    [facility] ADULT    [name]        Group Therapy Note    Attendees: 5/12    Group Type: Self-Esteem Bingo      Group Focus: Recreational therapy group focused on the topic self-esteem. PTs discussed the benefits and ways to boost self-esteem and how "buster" can lower self-esteem. Patients were able to acknowledge their own strengths and how to take responsibility.  This group may enhance social skills, increase self-esteem strategies.       Notes:  Patient did not attend group    Recreational Therapist  Eligha Kmetz

## 2023-04-21 NOTE — Behavioral Health Treatment Team (Signed)
Patient appeared to have slept 7 hours.

## 2023-04-21 NOTE — Plan of Care (Signed)
Problem: Self Harm/Suicidality  Goal: Will have no self-injury during hospital stay  Description: INTERVENTIONS:  1.  Ensure constant observer at bedside with Q15M safety checks  2.  Maintain a safe environment  3.  Secure patient belongings  4.  Ensure family/visitors adhere to safety recommendations  5.  Ensure safety tray has been added to patient's diet order  6.  Every shift and PRN: Re-assess suicidal risk via Frequent Screener    Outcome: Progressing     Problem: Depression  Goal: Will be euthymic at discharge  Description: INTERVENTIONS:  1. Administer medication as ordered  2. Provide emotional support via 1:1 interaction with staff  3. Encourage involvement in milieu/groups/activities  4. Monitor for social isolation  Outcome: Progressing     Problem: Drug Abuse/Detox  Goal: Will have no detox symptoms and will verbalize plan for changing drug-related behavior  Description: INTERVENTIONS:  1. Administer medication as ordered  2. Monitor physical status  3. Provide emotional support with 1:1 interaction with staff  4. Encourage  recovery focused treatment   Outcome: Progressing       Pt has been pleasant and cooperative. Denies si/hi and avh.  Was compliant w/ hs scheduled medications and ate hs snack. Will continue to monitor/support

## 2023-04-21 NOTE — Group Note (Signed)
Group Therapy Note    Date: 04/21/2023    Group Start Time: 1400  Group End Time: 1445  Group Topic: Psychoeducation      [name]        Group Therapy Note    Attendees: 4  Group Topic- Discussing Coping methods and different ways to manage stress.       Patient declined to participate  Discipline Responsible: Social Worker/Counselor      Signature:  Alvy Alsop
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
## 2023-04-21 NOTE — Group Note (Signed)
Art Therapy Group Progress Note    PATIENT SCHEDULED FOR GROUP AT: 1:00 PM    GROUP STOP TIME:  1:45 PM       ATTENDANCE: Low (4/12 participants)     TOPIC / FOCUS: Road to Wellbeing      GOALS:  define wellbeing, identify personal wellbeing goals, identify barriers and blockades, discuss effective and ineffective coping skills, identify successes and areas of growth, promote positive coping skills and creative problem solving     PARTICIPATION LEVEL:  Pt did not attend.     Alex Amberlie Gaillard  Art Therapist, MA, ATR-P  Provisional Registered Art Therapist

## 2023-04-22 MED ORDER — PALIPERIDONE ER 3 MG PO TB24
3 | Freq: Every evening | ORAL | Status: DC
Start: 2023-04-22 — End: 2023-04-27
  Administered 2023-04-23 – 2023-04-27 (×5): 3 mg via ORAL

## 2023-04-22 MED FILL — THIAMINE MONONITRATE 100 MG PO TABS: 100 MG | ORAL | Qty: 1

## 2023-04-22 MED FILL — BUSPIRONE HCL 5 MG PO TABS: 5 MG | ORAL | Qty: 1

## 2023-04-22 MED FILL — GABAPENTIN 300 MG PO CAPS: 300 MG | ORAL | Qty: 1

## 2023-04-22 MED FILL — LITHIUM CARBONATE 300 MG PO CAPS: 300 MG | ORAL | Qty: 2

## 2023-04-22 MED FILL — LEVOTHYROXINE SODIUM 25 MCG PO TABS: 25 MCG | ORAL | Qty: 1

## 2023-04-22 MED FILL — DULOXETINE HCL 30 MG PO CPEP: 30 MG | ORAL | Qty: 2

## 2023-04-22 MED FILL — XARELTO 10 MG PO TABS: 10 MG | ORAL | Qty: 1

## 2023-04-22 MED FILL — METHOCARBAMOL 500 MG PO TABS: 500 MG | ORAL | Qty: 2

## 2023-04-22 MED FILL — NALTREXONE HCL 50 MG PO TABS: 50 MG | ORAL | Qty: 1

## 2023-04-22 MED FILL — MONTELUKAST SODIUM 10 MG PO TABS: 10 MG | ORAL | Qty: 1

## 2023-04-22 MED FILL — HYDROXYZINE HCL 50 MG PO TABS: 50 MG | ORAL | Qty: 1

## 2023-04-22 MED FILL — TRAZODONE HCL 50 MG PO TABS: 50 MG | ORAL | Qty: 1

## 2023-04-22 MED FILL — LITHIUM CARBONATE 300 MG PO CAPS: 300 MG | ORAL | Qty: 1

## 2023-04-22 MED FILL — FOLIC ACID 1 MG PO TABS: 1 MG | ORAL | Qty: 1

## 2023-04-22 NOTE — Plan of Care (Signed)
Problem: Self Harm/Suicidality  Goal: Will have no self-injury during hospital stay  Description: INTERVENTIONS:  1.  Ensure constant observer at bedside with Q15M safety checks  2.  Maintain a safe environment  3.  Secure patient belongings  4.  Ensure family/visitors adhere to safety recommendations  5.  Ensure safety tray has been added to patient's diet order  6.  Every shift and PRN: Re-assess suicidal risk via Frequent Screener    04/22/2023 2120 by Randa Lynn, RN  Flowsheets (Taken 04/22/2023 2120)  Will have no self-injury during hospital stay:   Every shift and PRN: Re-assess suicidal risk via Frequent Screener   Ensure constant observer at bedside with Q15M safety checks  04/22/2023 0914 by Virgia Land, RN  Outcome: Progressing     Problem: Depression  Goal: Will be euthymic at discharge  Description: INTERVENTIONS:  1. Administer medication as ordered  2. Provide emotional support via 1:1 interaction with staff  3. Encourage involvement in milieu/groups/activities  4. Monitor for social isolation  04/22/2023 0914 by Virgia Land, RN  Outcome: Progressing     Problem: Anxiety  Goal: Will report anxiety at manageable levels  Description: INTERVENTIONS:  1. Administer medication as ordered  2. Teach and rehearse alternative coping skills  3. Provide emotional support with 1:1 interaction with staff  Flowsheets (Taken 04/22/2023 2120)  Will report anxiety at manageable levels:   Administer medication as ordered   Teach and rehearse alternative coping skills   Provide emotional support with 1:1 interaction with staff   Pt. is pleasant and compliant. She offers no complaint. She is free from falls, self harm or harming others.

## 2023-04-22 NOTE — Other (Signed)
.   Bipolar 2 disorder, mixed type.   2. Alcohol use disorder, severe.  3. Cannabis use disorder, moderate.   4. Cocaine use disorder, mild.  Axis II:  None.    Axis III: Allergic rhinitis.  Fibromyalgia by history.  Hypothyroidism.  History of thrombophlebitis.          Pt 's medication is being adjusted. Pt will be assisted with referrals t SA residential programs.     SW Contact SW met with pt. Pt asked to be referred to SA residential. Covering SW assisted pt with signing release to [facility] [phone]) . SW referred pt to the program. Pt would like updated notes. Covering SW provided mental health/Sa education. Pt  was encouraged to attend groups. Pt denies ideations ad hallucinations. Pt. Anxious and has little insight. Pt will be assisted with support towards planning.       Briar Sword Harris-Nash HS-BCP, MA, LMHP-R

## 2023-04-22 NOTE — Group Note (Addendum)
Group Therapy Note    Date: 04/22/2023    Group Start Time: 1500  Group End Time: 1545  Group Topic: Recreational    [facility] ADULT    [name]        Group Therapy Note    Attendees: 9/14    Group Type: Trivia  Group Focus: Recreational therapy group engaged through mental health and wellness trivia. Patients answered questions related to topics focusing on boundaries, stress, anxiety, ground techniques, self-esteem, and self-care(physical, mental,spiritual, emotional). This group may assist in increasing mood, social skills, grounding and self-care techniques, and reducing stress and anxiety.        Notes:  Patient did not attend group.    Recreational Therapist  Laverna Dossett

## 2023-04-22 NOTE — Group Note (Signed)
Group Therapy Note    Date: 04/22/2023    Group Start Time: 1400  Group End Time: 1500  Group Topic: Music Therapy      Desmond Dike        Group Therapy Note    Attendees: 10/14    Group Focus: Music therapy group explored themes of overcoming obstacles, interpersonal relationships, learning from the past, and finding fulfillment utilizing a collaborative group songwriting exercise. Group members worked together to [employer] to a well-known song while processing the lyrics they were contributing. The group's newly created song was then sung back with adding instruments. The group may promote self-expression, socialization, improve mood, and promote use of music as a coping skill.       Notes:  Pt attended the majority of group and left group early. Pt reported feeling "aggravated" and "anxious" at the start of group. Pt discussed the song lyric, "I think I can make it now the pain is gone," and discussed how she has PTSD from childhood trauma which causes her to have difficulty with trust in relationships. Pt contributed several lyrics to the group's song about thriving, the past, and getting rid of negativity.     Status After Intervention:  Unchanged    Participation Level: Interactive    Participation Quality: Appropriate, Attentive, Sharing, and Supportive      Speech:  normal      Thought Process/Content: Logical      Affective Functioning: Congruent      Mood: "aggravated," "anxious"      Level of consciousness:  Alert and Attentive      Response to Learning: Able to verbalize current knowledge/experience and Able to verbalize/acknowledge new learning      Endings: None Reported    Modes of Intervention: Support, Socialization, Exploration, and Media      Discipline Responsible: Social Worker/Counselor      Signature:  Desmond Dike, MT-BC, [facility]  Board-Certified Music Barrister's clerk

## 2023-04-22 NOTE — Group Note (Addendum)
Group Therapy Note    Date: 04/22/2023    Group Start Time: 0900  Group End Time: 1020  Group Topic: Recreational    [facility] ADULT    Maisie Fus, Jakhia Buxton        Group Therapy Note    Attendees: 6/13    Group Type: Relaxation/Music    Group Focus: Patients engaged in group through music. Patient listened to music that assisted them in relaxation. Patients engaged in discussions about relaxation techniques. This group may help reduce stress, anxiety, and depression, and improve mood.           Notes:  Patient did not engage in group.    Recreational Therapist  Sharan Mcenaney Maisie Fus

## 2023-04-22 NOTE — Plan of Care (Signed)
Problem: Discharge Planning  Goal: Discharge to home or other facility with appropriate resources  Outcome: Progressing     Problem: Self Harm/Suicidality  Goal: Will have no self-injury during hospital stay  Description: INTERVENTIONS:  1.  Ensure constant observer at bedside with Q15M safety checks  2.  Maintain a safe environment  3.  Secure patient belongings  4.  Ensure family/visitors adhere to safety recommendations  5.  Ensure safety tray has been added to patient's diet order  6.  Every shift and PRN: Re-assess suicidal risk via Frequent Screener    04/22/2023 0914 by Virgia Land, RN  Outcome: Progressing  04/21/2023 2145 by Ilda Basset, RN  Outcome: Progressing     Problem: Depression  Goal: Will be euthymic at discharge  Description: INTERVENTIONS:  1. Administer medication as ordered  2. Provide emotional support via 1:1 interaction with staff  3. Encourage involvement in milieu/groups/activities  4. Monitor for social isolation  04/22/2023 0914 by Virgia Land, RN  Outcome: Progressing  04/21/2023 2145 by Ilda Basset, RN  Outcome: Progressing     Problem: Drug Abuse/Detox  Goal: Will have no detox symptoms and will verbalize plan for changing drug-related behavior  Description: INTERVENTIONS:  1. Administer medication as ordered  2. Monitor physical status  3. Provide emotional support with 1:1 interaction with staff  4. Encourage  recovery focused treatment   04/21/2023 2145 by Ilda Basset, RN  Outcome: Progressing     Pt presents with bright affect, euthymic mood, with linear and goal-directed thought process. Pt has been selectively social on the unit. CIWA score of 0 this morning. Pt denies SI/HI/AVH. Pt is medication compliant.

## 2023-04-22 NOTE — Progress Notes (Signed)
Behavioral Health Progress Note    Admit Date: 04/20/2023  Hospital day 2    Vitals : Patient Vitals for the past 8 hrs:   BP Temp Temp src Pulse Resp SpO2   04/22/23 1130 127/83 98 F (36.7 C) Oral 80 17 99 %     Labs:  No results found for this or any previous visit (from the past 24 hour(s)).  Meds:   Current Facility-Administered Medications   Medication Dose Route Frequency    thiamine mononitrate tablet 100 mg  100 mg Oral Daily    methocarbamol (ROBAXIN) tablet 750 mg  750 mg Oral TID    levothyroxine (SYNTHROID) tablet 175 mcg  175 mcg Oral Daily    rivaroxaban (XARELTO) tablet 10 mg  10 mg Oral Daily    gabapentin (NEURONTIN) capsule 300 mg  300 mg Oral TID    DULoxetine (CYMBALTA) extended release capsule 60 mg  60 mg Oral BID    folic acid (FOLVITE) tablet 1 mg  1 mg Oral Daily    naltrexone (DEPADE) tablet 50 mg  50 mg Oral Daily with breakfast    busPIRone (BUSPAR) tablet 15 mg  15 mg Oral TID    lithium capsule 300 mg  300 mg Oral Daily    lithium capsule 600 mg  600 mg Oral Nightly    montelukast (SINGULAIR) tablet 10 mg  10 mg Oral Nightly    acetaminophen (TYLENOL) tablet 650 mg  650 mg Oral Q4H PRN    aluminum & magnesium hydroxide-simethicone (MAALOX) 200-200-20 MG/5ML suspension 30 mL  30 mL Oral Q6H PRN    hydrOXYzine HCl (ATARAX) tablet 25 mg  25 mg Oral Q6H PRN    traZODone (DESYREL) tablet 50 mg  50 mg Oral Nightly PRN      Hospital Problems: Principal Problem:    Moderate mixed bipolar II disorder (HCC)  Active Problems:    Alcohol use disorder, severe, dependence (HCC)    Fibromyalgia    Allergic rhinitis    Hypothyroid  Resolved Problems:    * No resolved hospital problems. *      Subjective:   Medication side effects: none      Mental Status Exam  Sensorium: alert  Orientation: only aware of time, place, and person  Relations: guarded and passive  Eye Contact: poor  Appearance: is unkempt  Thought Process: slow rate of thoughts, poor abstract reasoning/computation, and logical    Thought  Content: no evidence of impairment   Suicidal: denies   Homicidal: denies   Mood: is angry, is anxious, and is sad   Affect: labile  Memory: shows no evidence of impairment     Concentration: distractable  Abstraction: concrete  Insight: The patient shows little insight    OR Poor  Judgement: is psychologically impaired OR  Poor    Assessment/Plan:   not changed    Continue close observation,    Nurses report CIWA score was 0 yesterday but this morning her CIWA score was 4 and did receive as needed hydroxyzine.  She was said to sleep all night yes she tells me that she is still tired and could sleep all day.  Insists that she needs to be back on medication that helps with mood stabilization that she previously talked and she was able to recall that she had been on paliperidone 3 mg at bedtime.  We will resume this since it may be helpful for bipolar disorder.  She continue pneumo vaccine gabapentin 4 continued back  pain.  Continues to say she wants a substance abuse rehabilitation program.  Says that the [facility] at the [place] treatment center and [place] area was acceptable and she did well there would be acceptable to return there.  Does not [facility] to [facility] and she said there was another program in [place] that were unacceptable saying that they did not supply adequate services.  Continues to say that she does need a substance abuse treatment program so she will not relapse and then she wants to return after that to go back to her home area of [place] to live in some type of a sober living environment and attend intensive outpatient groups to stay sober.  Does not want to live in this area does not want to live in the [place] area.  Denies medication complaints with her current medicines.  Denies hallucinations or paranoia.  Continues to feel unhappy with her life situation.  Contracts for safety in the hospital.  Lithium blood level for Friday morning

## 2023-04-22 NOTE — Behavioral Health Treatment Team (Signed)
Patient appeared to have slept 7 hours.

## 2023-04-22 NOTE — Progress Notes (Signed)
CIWA score of 4. Pt received PRN Atarax 25 mg. Will continue to monitor for effectiveness.

## 2023-04-22 NOTE — Group Note (Signed)
Art Therapy Group Progress Note    PATIENT SCHEDULED FOR GROUP AT: 1:00 PM    GROUP STOP TIME:  1:45 PM       ATTENDANCE: Moderate (6/14 participants)     TOPIC / FOCUS:  Hold On vs. Let Go   Hands, Heart, or Mandala      GOALS:  identify counterproductive behaviors or habits, identify current coping skills, identify personal anchors, encourage self-awareness, promote positive coping skills, practice creativity, encourage focus    PARTICIPATION LEVEL:  Pt did not attend.     Alex Sherle Mello  Art Therapist, MA, ATR-P  Provisional Registered Art Therapist

## 2023-04-23 MED FILL — DULOXETINE HCL 30 MG PO CPEP: 30 MG | ORAL | Qty: 2

## 2023-04-23 MED FILL — GABAPENTIN 300 MG PO CAPS: 300 MG | ORAL | Qty: 1

## 2023-04-23 MED FILL — PALIPERIDONE ER 3 MG PO TB24: 3 MG | ORAL | Qty: 1

## 2023-04-23 MED FILL — LEVOTHYROXINE SODIUM 150 MCG PO TABS: 150 MCG | ORAL | Qty: 1

## 2023-04-23 MED FILL — TRAZODONE HCL 50 MG PO TABS: 50 MG | ORAL | Qty: 1

## 2023-04-23 MED FILL — HYDROXYZINE HCL 50 MG PO TABS: 50 MG | ORAL | Qty: 1

## 2023-04-23 MED FILL — FOLIC ACID 1 MG PO TABS: 1 MG | ORAL | Qty: 1

## 2023-04-23 MED FILL — BUSPIRONE HCL 5 MG PO TABS: 5 MG | ORAL | Qty: 1

## 2023-04-23 MED FILL — LITHIUM CARBONATE 300 MG PO CAPS: 300 MG | ORAL | Qty: 2

## 2023-04-23 MED FILL — METHOCARBAMOL 500 MG PO TABS: 500 MG | ORAL | Qty: 2

## 2023-04-23 MED FILL — MONTELUKAST SODIUM 10 MG PO TABS: 10 MG | ORAL | Qty: 1

## 2023-04-23 MED FILL — THIAMINE MONONITRATE 100 MG PO TABS: 100 MG | ORAL | Qty: 1

## 2023-04-23 MED FILL — LITHIUM CARBONATE 300 MG PO CAPS: 300 MG | ORAL | Qty: 1

## 2023-04-23 MED FILL — XARELTO 10 MG PO TABS: 10 MG | ORAL | Qty: 1

## 2023-04-23 MED FILL — NALTREXONE HCL 50 MG PO TABS: 50 MG | ORAL | Qty: 1

## 2023-04-23 NOTE — Group Note (Signed)
Group Therapy Note    Date: 04/23/2023    Group Start Time: 1420  Group End Time: 1500  Group Topic: Music Therapy    Desmond Dike        Group Therapy Note    Attendees: 7/13    Group Focus: Music therapy group consisted of psychoeducation on the creation of mood regulation playlists. The group created a playlist moving from the group's undesired mood state of sad to their desired mood state of upbeat/energetic. Group members assigned a Likert scale of 1-5 for the emotional content of the music, lyrics, and overall song to then order the playlist. The group may promote use of music as a tool to help regulate mood.       Notes:  Pt did not attend group, appearing to be sleeping when invited to group.      Discipline Responsible: Social Worker/Counselor      Signature:  Desmond Dike, MT-BC, [facility]  Board-Certified Music Barrister's clerk

## 2023-04-23 NOTE — Group Note (Signed)
Group Therapy Note    Date: 04/23/2023    Group Start Time: 1500  Group End Time: 1545  Group Topic: Recreational    [facility] ADULT    Maisie Fus, Ahmia Colford        Group Therapy Note    Attendees: 4/14    Group Type: Name That Tune   Group Focus: Recreational Therapist engaged patients through name that tune. This group may help/promote reducing stress, anxiety, and symptoms of depression, while improving mood, well-being, and communication with peers/ team.        Notes: Patient did not attend group.    Recreational Therapist  Jaydien Panepinto Maisie Fus

## 2023-04-23 NOTE — Plan of Care (Signed)
Problem: Self Harm/Suicidality  Goal: Will have no self-injury during hospital stay  Description: INTERVENTIONS:  1.  Ensure constant observer at bedside with Q15M safety checks  2.  Maintain a safe environment  3.  Secure patient belongings  4.  Ensure family/visitors adhere to safety recommendations  5.  Ensure safety tray has been added to patient's diet order  6.  Every shift and PRN: Re-assess suicidal risk via Frequent Screener    04/23/2023 2056 by Randa Lynn, RN  Flowsheets (Taken 04/22/2023 2120)  Will have no self-injury during hospital stay:   Every shift and PRN: Re-assess suicidal risk via Frequent Screener   Ensure constant observer at bedside with Q15M safety checks  04/23/2023 1024 by Virgia Land, RN  Outcome: Progressing     Problem: Depression  Goal: Will be euthymic at discharge  Description: INTERVENTIONS:  1. Administer medication as ordered  2. Provide emotional support via 1:1 interaction with staff  3. Encourage involvement in milieu/groups/activities  4. Monitor for social isolation  04/23/2023 1024 by Virgia Land, RN  Outcome: Progressing     Problem: Anxiety  Goal: Will report anxiety at manageable levels  Description: INTERVENTIONS:  1. Administer medication as ordered  2. Teach and rehearse alternative coping skills  3. Provide emotional support with 1:1 interaction with staff  Flowsheets (Taken 04/22/2023 2120)  Will report anxiety at manageable levels:   Administer medication as ordered   Teach and rehearse alternative coping skills   Provide emotional support with 1:1 interaction with staff   Pt. is irritable and rude this evening. She remain compliant with her medications. She is free from falls, self harm or harming others.

## 2023-04-23 NOTE — Group Note (Signed)
Group Therapy Note    Date: 04/23/2023    Group Start Time: 0930  Group End Time: 1030  Group Topic: Music Therapy      Desmond Dike        Group Therapy Note    Attendees: 6/14    Group Focus: Music therapy group utilized the intervention of lyric analysis with patient preferred music where patients listened to and discussed songs that are personally meaningful to them. Group members discussed the music in relation to their lives and their mental health. The group also discussed the use of music as an outlet for emotional expression and as a form of coping.         Notes:  Patient did not attend group, appearing to be sleeping when invited to group.      Discipline Responsible: Social Worker/Counselor      Signature:  Desmond Dike, MT-BC, [facility]  Board-Certified Music Barrister's clerk

## 2023-04-23 NOTE — Group Note (Signed)
Art Therapy Group Progress Note    PATIENT SCHEDULED FOR GROUP AT: 1:00 PM    GROUP STOP TIME:  1:45 PM       ATTENDANCE: Moderate (5/14 participants)     TOPIC / FOCUS: Positive Affirmation Coloring Sheet    GOALS: define positive affirmations, identify ways to incorporate affirmations into daily practice, encourage creativity, increase focus, promote social skills/connection, increase positive feelings, increase autonomy, promote positive coping skills     PARTICIPATION LEVEL:  Pt did not attend.     Alex Jaiveer Panas  Art Therapist, MA, ATR-P  Provisional Registered Art Therapist

## 2023-04-23 NOTE — Progress Notes (Signed)
Pt completed phone assessment with [facility]. No bed availability at either location at present. Will continue to monitor.

## 2023-04-23 NOTE — Behavioral Health Treatment Team (Signed)
Patient appeared to have slept 7 hours.

## 2023-04-23 NOTE — Progress Notes (Signed)
Behavioral Health Progress Note    Admit Date: 04/20/2023  Hospital day 3    Vitals : Patient Vitals for the past 8 hrs:   BP Temp Temp src Pulse Resp SpO2   04/23/23 1145 110/78 98 F (36.7 C) Oral 100 18 100 %     Labs:  No results found for this or any previous visit (from the past 24 hour(s)).  Meds:   Current Facility-Administered Medications   Medication Dose Route Frequency    paliperidone (INVEGA) extended release tablet 3 mg  3 mg Oral Nightly    thiamine mononitrate tablet 100 mg  100 mg Oral Daily    methocarbamol (ROBAXIN) tablet 750 mg  750 mg Oral TID    levothyroxine (SYNTHROID) tablet 175 mcg  175 mcg Oral Daily    rivaroxaban (XARELTO) tablet 10 mg  10 mg Oral Daily    gabapentin (NEURONTIN) capsule 300 mg  300 mg Oral TID    DULoxetine (CYMBALTA) extended release capsule 60 mg  60 mg Oral BID    folic acid (FOLVITE) tablet 1 mg  1 mg Oral Daily    naltrexone (DEPADE) tablet 50 mg  50 mg Oral Daily with breakfast    busPIRone (BUSPAR) tablet 15 mg  15 mg Oral TID    lithium capsule 300 mg  300 mg Oral Daily    lithium capsule 600 mg  600 mg Oral Nightly    montelukast (SINGULAIR) tablet 10 mg  10 mg Oral Nightly    acetaminophen (TYLENOL) tablet 650 mg  650 mg Oral Q4H PRN    aluminum & magnesium hydroxide-simethicone (MAALOX) 200-200-20 MG/5ML suspension 30 mL  30 mL Oral Q6H PRN    hydrOXYzine HCl (ATARAX) tablet 25 mg  25 mg Oral Q6H PRN    traZODone (DESYREL) tablet 50 mg  50 mg Oral Nightly PRN      Hospital Problems: Principal Problem:    Moderate mixed bipolar II disorder (HCC)  Active Problems:    Alcohol use disorder, severe, dependence (HCC)    Fibromyalgia    Allergic rhinitis    Hypothyroid  Resolved Problems:    * No resolved hospital problems. *      Subjective:   Medication side effects: none  none    Mental Status Exam  Sensorium:   Orientation: only aware of time, place, and person  Relations: guarded and passive  Eye Contact: poor  Appearance: is tense  Thought Process: slow rate  of thoughts, poor abstract reasoning/computation, and logical    Thought Content: no evidence of impairment   Suicidal: denies   Homicidal: denies   Mood: is anxious, is sad, and is irritable   Affect: stable  Memory: shows no evidence of impairment     Concentration: distractable  Abstraction: concrete  Insight: The patient shows little insight    OR Fair  Judgement: is psychologically impaired OR  Fair    Assessment/Plan:   not changed      Continue close observation,   Nurses report patient has spent most of the time lying in bed.  She had not gotten up and participated.  She apparently said that she was too anxious in dealing with a particular female peer who was very loud and aggressive and frightened her and she would return back to her room being afraid to come out.  Blood pressure had been low though has come back up.  [facility] and did the telephone interview.  Nurses  note says that there were no beds available.  Will have to wait and see what they end up telling social work.  Does say that she was feeling a little dizzy at first when she got up this morning but not feeling that way now.  No other medication complaints wants to continue medications as is.  Denies homicidal or suicidal ideas hallucinations or delusions.

## 2023-04-23 NOTE — Progress Notes (Addendum)
SW Contact:      Pt Contact: Writer met with pt informing her that "no" decision made yet despite interview done about acceptance to:    [facility]  # [phone]    SW & [facility] staff continue to encourage pt to be more involved in groups & activities to provide her a sense of accomplishment with task completion. She did hesitate mostly due to inappropriate female peer, but commented will try.    Dr & Tx Team updated.

## 2023-04-23 NOTE — Progress Notes (Signed)
CIWA score of 3. Pt received PRN Atarax 25 mg. Will continue to monitor for effectiveness.

## 2023-04-23 NOTE — Plan of Care (Addendum)
Problem: Discharge Planning  Goal: Discharge to home or other facility with appropriate resources  Outcome: Progressing     Problem: Self Harm/Suicidality  Goal: Will have no self-injury during hospital stay  Description: INTERVENTIONS:  1.  Ensure constant observer at bedside with Q15M safety checks  2.  Maintain a safe environment  3.  Secure patient belongings  4.  Ensure family/visitors adhere to safety recommendations  5.  Ensure safety tray has been added to patient's diet order  6.  Every shift and PRN: Re-assess suicidal risk via Frequent Screener    04/23/2023 1024 by Virgia Land, RN  Outcome: Progressing  04/22/2023 2120 by Randa Lynn, RN  Flowsheets (Taken 04/22/2023 2120)  Will have no self-injury during hospital stay:   Every shift and PRN: Re-assess suicidal risk via Frequent Screener   Ensure constant observer at bedside with Q15M safety checks     Problem: Depression  Goal: Will be euthymic at discharge  Description: INTERVENTIONS:  1. Administer medication as ordered  2. Provide emotional support via 1:1 interaction with staff  3. Encourage involvement in milieu/groups/activities  4. Monitor for social isolation  Outcome: Progressing     Pt presents with dull affect, depressed mood, preoccupied thought process, fixated on back pain for which she received scheduled Robaxin and Gabapentin this morning. Pt has been withdrawn to self on the unit. CIWA score of 0 this morning. Pt denies SI/HI/AVH. Pt is medication compliant.

## 2023-04-24 LAB — LITHIUM LEVEL: Lithium: 0.8 MMOL/L (ref 0.6–1.2)

## 2023-04-24 MED FILL — HYDROXYZINE HCL 50 MG PO TABS: 50 MG | ORAL | Qty: 1

## 2023-04-24 MED FILL — DULOXETINE HCL 30 MG PO CPEP: 30 MG | ORAL | Qty: 2

## 2023-04-24 MED FILL — METHOCARBAMOL 500 MG PO TABS: 500 MG | ORAL | Qty: 2

## 2023-04-24 MED FILL — BUSPIRONE HCL 5 MG PO TABS: 5 MG | ORAL | Qty: 1

## 2023-04-24 MED FILL — LITHIUM CARBONATE 300 MG PO CAPS: 300 MG | ORAL | Qty: 1

## 2023-04-24 MED FILL — LITHIUM CARBONATE 300 MG PO CAPS: 300 MG | ORAL | Qty: 2

## 2023-04-24 MED FILL — THIAMINE MONONITRATE 100 MG PO TABS: 100 MG | ORAL | Qty: 1

## 2023-04-24 MED FILL — GABAPENTIN 300 MG PO CAPS: 300 MG | ORAL | Qty: 1

## 2023-04-24 MED FILL — MONTELUKAST SODIUM 10 MG PO TABS: 10 MG | ORAL | Qty: 1

## 2023-04-24 MED FILL — TRAZODONE HCL 50 MG PO TABS: 50 MG | ORAL | Qty: 1

## 2023-04-24 MED FILL — NALTREXONE HCL 50 MG PO TABS: 50 MG | ORAL | Qty: 1

## 2023-04-24 MED FILL — XARELTO 10 MG PO TABS: 10 MG | ORAL | Qty: 1

## 2023-04-24 MED FILL — LEVOTHYROXINE SODIUM 25 MCG PO TABS: 25 MCG | ORAL | Qty: 1

## 2023-04-24 MED FILL — PALIPERIDONE ER 3 MG PO TB24: 3 MG | ORAL | Qty: 1

## 2023-04-24 MED FILL — FOLIC ACID 1 MG PO TABS: 1 MG | ORAL | Qty: 1

## 2023-04-24 NOTE — Progress Notes (Signed)
Pt. slept 8 hrs.

## 2023-04-24 NOTE — Group Note (Signed)
Art Therapy Group Progress Note    PATIENT SCHEDULED FOR GROUP AT: 9:30 AM     GROUP STOP TIME:  10:15 AM        ATTENDANCE: Low (4/12 participants)     TOPIC / FOCUS: Mindfulness - Motivational Word Mandala     GOALS: define mindfulness, identify current feelings, encourage healthy emotional management, practicing positive coping skills, encourage focus, promote self-awareness, reduce stress and anxiety     PARTICIPATION LEVEL:  Pt did not attend.     Alex Kent Braunschweig  Art Therapist, MA, ATR-P  Provisional Registered Art Therapist

## 2023-04-24 NOTE — Progress Notes (Signed)
Behavioral Health Progress Note    Admit Date: 04/20/2023  Hospital day 4    Vitals : Patient Vitals for the past 8 hrs:   BP Temp Temp src Pulse Resp SpO2   04/24/23 0845 130/80 97.5 F (36.4 C) Oral 74 20 100 %   04/24/23 0815 106/75 97.1 F (36.2 C) Oral 85 18 100 %     Labs:    Recent Results (from the past 24 hour(s))   Lithium Level    Collection Time: 04/24/23  6:55 AM   Result Value Ref Range    Lithium 0.80 0.6 - 1.2 MMOL/L     Meds:   Current Facility-Administered Medications   Medication Dose Route Frequency    paliperidone (INVEGA) extended release tablet 3 mg  3 mg Oral Nightly    thiamine mononitrate tablet 100 mg  100 mg Oral Daily    methocarbamol (ROBAXIN) tablet 750 mg  750 mg Oral TID    levothyroxine (SYNTHROID) tablet 175 mcg  175 mcg Oral Daily    rivaroxaban (XARELTO) tablet 10 mg  10 mg Oral Daily    gabapentin (NEURONTIN) capsule 300 mg  300 mg Oral TID    DULoxetine (CYMBALTA) extended release capsule 60 mg  60 mg Oral BID    folic acid (FOLVITE) tablet 1 mg  1 mg Oral Daily    naltrexone (DEPADE) tablet 50 mg  50 mg Oral Daily with breakfast    busPIRone (BUSPAR) tablet 15 mg  15 mg Oral TID    lithium capsule 300 mg  300 mg Oral Daily    lithium capsule 600 mg  600 mg Oral Nightly    montelukast (SINGULAIR) tablet 10 mg  10 mg Oral Nightly    acetaminophen (TYLENOL) tablet 650 mg  650 mg Oral Q4H PRN    aluminum & magnesium hydroxide-simethicone (MAALOX) 200-200-20 MG/5ML suspension 30 mL  30 mL Oral Q6H PRN    hydrOXYzine HCl (ATARAX) tablet 25 mg  25 mg Oral Q6H PRN    traZODone (DESYREL) tablet 50 mg  50 mg Oral Nightly PRN      Hospital Problems: Principal Problem:    Moderate mixed bipolar II disorder (HCC)  Active Problems:    Alcohol use disorder, severe, dependence (HCC)    Fibromyalgia    Allergic rhinitis    Hypothyroid  Resolved Problems:    * No resolved hospital problems. *      Subjective:   Medication side effects: none      Mental Status Exam  Sensorium:  alert  Orientation: only aware of time, place, and person  Relations: guarded  Eye Contact: poor  Appearance: shows no evidence of impairment  Thought Process: slow rate of thoughts, poor abstract reasoning/computation, and logical    Thought Content: no evidence of impairment   Suicidal: denies   Homicidal: denies   Mood: unhappy   Affect: stable  Memory: is impaired and is remote     Concentration: distractable  Abstraction: concrete  Insight: The patient shows little insight    OR Fair  Judgement: is psychologically impaired OR  Fair    Assessment/Plan:   improved    Nurses report patient slept about 8 hours but then the patient says that she only slept several hours.  She does nap during the daytime now.  Had a lithium level drawn today which is therapeutic 0.8.  She says she gets a little dizzy when she stands up to her blood pressure is now back to  normal.  She denies withdrawal symptoms.  She says she feels a little bit depressed.  She says trazodone worked her first to help her sleep and stopped working.  She has been on melatonin in the past low-dose said he did not do much.  We will resume it at 6 mg if it helps.  She was being evaluated by [facility] but apparently they had a question about her being on gabapentin as a pain medication and sounds as though they may not be accepting her.  She insists that she needs the gabapentin in order to be functional.  In the meantime we will continue her with her paliperidone and duloxetine.  Staff will continue to try to find a substance abuse treatment program for her.

## 2023-04-24 NOTE — Group Note (Signed)
Art Therapy Group Progress Note    PATIENT SCHEDULED FOR GROUP AT: 2:00 PM     GROUP STOP TIME:  2:45 PM     ATTENDANCE: Low (4/12 participants)     TOPIC / FOCUS:  Create a Personal Symbol of Hope (air-dry clay)     GOALS:  practice mindfulness through sensory interactions, decrease symptoms of stress, promote healthful coping strategies, encourage creativity, reduce negative thoughts and feelings, increase emotional communication skills, increase autonomy, encourage focus     PARTICIPATION LEVEL:  Pt did not attend.     Alex Annalynne Ibanez  Art Therapist, MA, ATR-P  Provisional Registered Art Therapist

## 2023-04-24 NOTE — Group Note (Signed)
Group Therapy Note    Date: 04/24/2023    Group Start Time: 1300  Group End Time: 1355  Group Topic: Music Therapy      Leanna, Caplan        Group Therapy Note    Attendees: 4/12    Group Focus: Therapist provided choice of music therapy interventions and group members selected relaxation and exploring the use of music and imagery. The group started with a music-assisted visualization involving the ACT Leaves on a Stream cognitive defusion technique. The group then continued with group members sharing songs that remind them of peaceful, comforting imagery. Group closed with therapist playing an instrumental piece of music to evoke supportive imagery and support integration of concepts explored in group. The group may promote accessing of inner resources and imagery and use of music as coping skills.       Notes:  Patient did not attend group.      Discipline Responsible: Social Worker/Counselor      Signature:  Leanna Caplan, MT-BC, LPC  Board-Certified Music Therapist  Licensed Professional Counselor

## 2023-04-24 NOTE — Progress Notes (Addendum)
SW Contact:      Collateral Contacts: spoke to admissions, Pt tentative Accepted at:    [facility]  # [phone]    Waiting for bed availability, writer calling every x2 hrs.9:30am, 11:30am    Addendum: 1:;30pm .. [facility] called & asked several questions about pts medical diagnosis, medications & mobility, as their admissions committee is still needing more data. Writer consulted with her Dr here about clarity of above.    [employer] [facility] Case Manager 2PM  [name]  # [phone]  Left update on pt compliance with tx plan    Pt given update on all above. Reminded again of how groups & activities can provide her direction, relief & support.    Dr & tx team updated.    Addendum; returned call 3:38pm to Ms [name] and again left message for her

## 2023-04-24 NOTE — Plan of Care (Addendum)
Problem: Self Harm/Suicidality  Goal: Will have no self-injury during hospital stay  Description: INTERVENTIONS:  1.  Ensure constant observer at bedside with Q15M safety checks  2.  Maintain a safe environment  3.  Secure patient belongings  4.  Ensure family/visitors adhere to safety recommendations  5.  Ensure safety tray has been added to patient's diet order  6.  Every shift and PRN: Re-assess suicidal risk via Frequent Screener    04/24/2023 1051 by Ladona Horns, RN  Outcome: Progressing  04/23/2023 2056 by Randa Lynn, RN  Flowsheets (Taken 04/22/2023 2120)  Will have no self-injury during hospital stay:   Every shift and PRN: Re-assess suicidal risk via Frequent Screener   Ensure constant observer at bedside with Q15M safety checks     Problem: Depression  Goal: Will be euthymic at discharge  Description: INTERVENTIONS:  1. Administer medication as ordered  2. Provide emotional support via 1:1 interaction with staff  3. Encourage involvement in milieu/groups/activities  4. Monitor for social isolation  Outcome: Progressing     Problem: Anxiety  Goal: Will report anxiety at manageable levels  Description: INTERVENTIONS:  1. Administer medication as ordered  2. Teach and rehearse alternative coping skills  3. Provide emotional support with 1:1 interaction with staff  04/24/2023 1051 by Ladona Horns, RN  Outcome: Progressing  04/23/2023 2056 by Randa Lynn, RN  Flowsheets (Taken 04/22/2023 2120)  Will report anxiety at manageable levels:   Administer medication as ordered   Teach and rehearse alternative coping skills   Provide emotional support with 1:1 interaction with staff   Patient is pleasant , affect flat, mood stable. Denies SI. States she is depressed. Voiced no complaints. Will continue to monitor.

## 2023-04-24 NOTE — Plan of Care (Addendum)
Problem: Self Harm/Suicidality  Goal: Will have no self-injury during hospital stay  Description: INTERVENTIONS:  1.  Ensure constant observer at bedside with Q15M safety checks  2.  Maintain a safe environment  3.  Secure patient belongings  4.  Ensure family/visitors adhere to safety recommendations  5.  Ensure safety tray has been added to patient's diet order  6.  Every shift and PRN: Re-assess suicidal risk via Frequent Screener    04/24/2023 2231 by Eden Lathe, RN  Outcome: Progressing  04/24/2023 1051 by Ladona Horns, RN  Outcome: Progressing     Problem: Anxiety  Goal: Will report anxiety at manageable levels  Description: INTERVENTIONS:  1. Administer medication as ordered  2. Teach and rehearse alternative coping skills  3. Provide emotional support with 1:1 interaction with staff  04/24/2023 1051 by Ladona Horns, RN  Outcome: Progressing     Problem: Depression  Goal: Will be euthymic at discharge  Description: INTERVENTIONS:  1. Administer medication as ordered  2. Provide emotional support via 1:1 interaction with staff  3. Encourage involvement in milieu/groups/activities  4. Monitor for social isolation  04/24/2023 2231 by Eden Lathe, RN  Outcome: Not Progressing  04/24/2023 1051 by Ladona Horns, RN  Outcome: Progressing     Problem: Pain  Goal: Verbalizes/displays adequate comfort level or baseline comfort level  Outcome: Not Progressing    Pt is friendly, cooperative but withdrawn from peers. Pt is medication compliant and presents with flat facial expressions, blunt affect and a depressed mood. Pt denies all SI/HI/AVH but reports a feelings of depression (5/10) caused by "everything, stuff here and at home, all of it." Pt scored a "0" on her CIWA screening this evening. Pt elected to take PRN Atarax 25 mg and Trazodone 50 mg with evening medications. Pt reported pain in her lower back rated 6/10. Upon reassessment after medication administration, pain had decreased to a 2/10, pt was  satisfied with pain level. Pt has been isolated to self and withdrawn to room all evening. No bx issues observed. Pt is currently in room asleep, will continue to monitor for safety and comfort.

## 2023-04-24 NOTE — Group Note (Signed)
Group Therapy Note    Date: 04/24/2023    Group Start Time: 1500  Group End Time: 1545  Group Topic: Recreational    [facility]    Morissa Obeirne        Group Therapy Note    Attendees: 4/11    Group Type: Connect More   Group Focus: Recreational therapist engaged patients in a meaningful conversation that encourages players to reflect on their opinions, feelings, thoughts and goals. This group may help improve social skills, self-reflection, emotional skills, self-control and self-resilience.        Notes:  Patient did not attend group.    Recreational Therapist  Normand Damron

## 2023-04-25 MED ORDER — LIOTHYRONINE SODIUM 5 MCG PO TABS
5 | Freq: Every day | ORAL | Status: DC
Start: 2023-04-25 — End: 2023-04-25

## 2023-04-25 MED ORDER — LIOTHYRONINE SODIUM 5 MCG PO TABS
5 MCG | Freq: Every day | ORAL | Status: AC
Start: 2023-04-25 — End: 2023-04-27
  Administered 2023-04-25 – 2023-04-27 (×3): 5 ug via ORAL

## 2023-04-25 MED FILL — FOLIC ACID 1 MG PO TABS: 1 MG | ORAL | Qty: 1

## 2023-04-25 MED FILL — GABAPENTIN 300 MG PO CAPS: 300 MG | ORAL | Qty: 1

## 2023-04-25 MED FILL — BUSPIRONE HCL 5 MG PO TABS: 5 MG | ORAL | Qty: 1

## 2023-04-25 MED FILL — HYDROXYZINE HCL 50 MG PO TABS: 50 MG | ORAL | Qty: 1

## 2023-04-25 MED FILL — DULOXETINE HCL 30 MG PO CPEP: 30 MG | ORAL | Qty: 2

## 2023-04-25 MED FILL — PALIPERIDONE ER 3 MG PO TB24: 3 MG | ORAL | Qty: 1

## 2023-04-25 MED FILL — METHOCARBAMOL 500 MG PO TABS: 500 MG | ORAL | Qty: 2

## 2023-04-25 MED FILL — LEVOTHYROXINE SODIUM 25 MCG PO TABS: 25 MCG | ORAL | Qty: 1

## 2023-04-25 MED FILL — MONTELUKAST SODIUM 10 MG PO TABS: 10 MG | ORAL | Qty: 1

## 2023-04-25 MED FILL — LITHIUM CARBONATE 300 MG PO CAPS: 300 MG | ORAL | Qty: 2

## 2023-04-25 MED FILL — NALTREXONE HCL 50 MG PO TABS: 50 MG | ORAL | Qty: 1

## 2023-04-25 MED FILL — LITHIUM CARBONATE 300 MG PO CAPS: 300 MG | ORAL | Qty: 1

## 2023-04-25 MED FILL — XARELTO 10 MG PO TABS: 10 MG | ORAL | Qty: 1

## 2023-04-25 MED FILL — ACETAMINOPHEN 325 MG PO TABS: 325 MG | ORAL | Qty: 2

## 2023-04-25 MED FILL — CYTOMEL 5 MCG PO TABS: 5 MCG | ORAL | Qty: 1

## 2023-04-25 MED FILL — LIOTHYRONINE SODIUM 5 MCG PO TABS: 5 MCG | ORAL | Qty: 1

## 2023-04-25 MED FILL — THIAMINE MONONITRATE 100 MG PO TABS: 100 MG | ORAL | Qty: 1

## 2023-04-25 MED FILL — TRAZODONE HCL 50 MG PO TABS: 50 MG | ORAL | Qty: 1

## 2023-04-25 NOTE — Plan of Care (Signed)
Problem: Depression  Goal: Will be euthymic at discharge  Description: INTERVENTIONS:  1. Administer medication as ordered  2. Provide emotional support via 1:1 interaction with staff  3. Encourage involvement in milieu/groups/activities  4. Monitor for social isolation  04/25/2023 1045 by Tacy Dura, RN  Outcome: Progressing      Problem: Self Harm/Suicidality  Goal: Will have no self-injury during hospital stay  Description: INTERVENTIONS:  1.  Ensure constant observer at bedside with Q15M safety checks  2.  Maintain a safe environment  3.  Secure patient belongings  4.  Ensure family/visitors adhere to safety recommendations  5.  Ensure safety tray has been added to patient's diet order  6.  Every shift and PRN: Re-assess suicidal risk via Frequent Screener    04/25/2023 1045 by Tacy Dura, RN  Outcome: Progressing     Problem: Anxiety  Goal: Will report anxiety at manageable levels  Description: INTERVENTIONS:  1. Administer medication as ordered  2. Teach and rehearse alternative coping skills  3. Provide emotional support with 1:1 interaction with staff  Outcome: Progressing   Patient presents with a flat affect and depressed mood.  She denies SI/HI and AVH.  Patient complains of pain related to her Fibromyalgia; she received her scheduled medications to help alleviate her pain.  Patient is med/meal compliant.  No behavioral issues noted.

## 2023-04-25 NOTE — Behavioral Health Treatment Team (Signed)
Patient appeared to have slept 7 1/2 hrs.

## 2023-04-25 NOTE — Plan of Care (Signed)
Problem: Self Harm/Suicidality  Goal: Will have no self-injury during hospital stay  Description: INTERVENTIONS:  1.  Ensure constant observer at bedside with Q15M safety checks  2.  Maintain a safe environment  3.  Secure patient belongings  4.  Ensure family/visitors adhere to safety recommendations  5.  Ensure safety tray has been added to patient's diet order  6.  Every shift and PRN: Re-assess suicidal risk via Frequent Screener    04/25/2023 2120 by Eden Lathe, RN  Outcome: Progressing  04/25/2023 1045 by Tacy Dura, RN  Outcome: Progressing     Problem: Anxiety  Goal: Will report anxiety at manageable levels  Description: INTERVENTIONS:  1. Administer medication as ordered  2. Teach and rehearse alternative coping skills  3. Provide emotional support with 1:1 interaction with staff  04/25/2023 2120 by Eden Lathe, RN  Outcome: Progressing  04/25/2023 1045 by Tacy Dura, RN  Outcome: Progressing     Problem: Depression  Goal: Will be euthymic at discharge  Description: INTERVENTIONS:  1. Administer medication as ordered  2. Provide emotional support via 1:1 interaction with staff  3. Encourage involvement in milieu/groups/activities  4. Monitor for social isolation  04/25/2023 2120 by Eden Lathe, RN  Outcome: Not Progressing  04/25/2023 1045 by Tacy Dura, RN  Outcome: Progressing     Pt is friendly, cooperative but has been withdrawn from peers all evening. Pt is medication compliant and presents with flat facial expressions, blunt affect and a depressed mood. Pt denies all SI/HI/AVH but reports a feelings of depression (5/10) caused by "my condition." Pt scored a "0" on her CIWA screening this evening. Pt elected to take PRN Atarax 25 mg and Trazodone 50 mg with evening medications. Pt reported pain in her lower back rated 3/10. Upon reassessment after medication administration, pain had decreased to a 1/10, pt was satisfied with pain level. Pt has been isolated to self and  withdrawn to room all evening. No bx issues observed. Pt is currently in room asleep, will continue to monitor for safety and comfort.     Due to patient's afternoon medications being administered late, Dr. Fortino Sic was notified and elected to cancel her 2100 dose of Buspar, Gabapentin, and Robaxin and continue these medications with the next scheduled doses at 0900 on 04/26/2023.

## 2023-04-25 NOTE — Progress Notes (Signed)
[facility]  Inpatient Progress Note     Date of Service: 04/25/23  Hospital Day: 5     Subjective/Interval History   04/25/23    Treatment Team Notes:  Notes reviewed and/or discussed and report that [name] is a patient recently admitted to our facility attention invited to the admission note which is self-explanatory.      Patient interview: [name] was interviewed by this Clinical research associate today.  The patient's case was discussed with the staff.  When the undersigned met with the patient during psych rounds, she continues to describe having problems with her sleeping patterns, however it appears that she slept better last night than before.  Otherwise there is no major evidence of medications related side effects.  The patient continues to describe her being depressed however able to CFS while she is in the facility.  Pharmacist staff calls regarding the patient being treated with with liothyronine in addition to her being prescribed with levothyroxine as an outpatient.  Dose of the former is 5 mcg daily.  The patient indicated that she takes it regularly.  So we will be adding it to her treatment regimen.  In my experience, treatment with liothyronine (Cytomel) tends to be utilized for antidepressant augmentation however it appears that the patient is being prescribed with it for her history of hypothyroidism.  Again, we will proceed with the prescription.      Objective     Vitals:    04/25/23 0729   BP: 102/70   Pulse: 80   Resp: 18   Temp: 98.2 F (36.8 C)   SpO2: 91%     Vitals are stable  No results found for this or any previous visit (from the past 24 hour(s)).    Mental Status Examination     Appearance/Hygiene 47 y.o. Unavailable female  Hygiene: Limited   Behavior/Social Relatedness Withdrawn   Musculoskeletal Gait/Station: Not assessed  Tone (flaccid, cogwheeling, spastic): not assessed  Psychomotor (hyperkinetic, hypokinetic): calm   Involuntary movements (tics,  dyskinesias, akathisa, stereotypies): none   Speech   Rate, rhythm, volume, fluency and articulation are appropriate   Mood   Depressed   Affect    Flat   Thought Process Linear and goal directed   Thought Content and Perceptual Disturbances Denies self-injurious behavior (SIB), suicidal ideation (SI), aggressive behavior or homicidal ideation (HI)  Denies auditory and visual hallucinations, ideas of reference or influence of any delusional thoughts   Sensorium and Cognition  Grossly intact   Insight  Limited   Judgment Limited to fair        Assessment/Plan      Psychiatric Diagnoses:   Patient Active Problem List   Diagnosis    Moderate mixed bipolar II disorder (HCC)    Alcohol use disorder, severe, dependence (HCC)    Fibromyalgia    Allergic rhinitis    Hypothyroid       Medical Diagnoses: Per attending physician.      1.  Cytomel will be added to the patient's treatment regimen.  Treatment with Synthroid will be maintained the same.  Other meds will be maintained the same also.  2.  Reviewed instructions, risks, benefits and side effects of medications  3.  Disposition/Discharge Date: self-care/home, TBD.    Faustino Congress, MD. [facility]  [facility]  Psychiatry

## 2023-04-26 MED FILL — DULOXETINE HCL 30 MG PO CPEP: 30 MG | ORAL | Qty: 2

## 2023-04-26 MED FILL — LITHIUM CARBONATE 300 MG PO CAPS: 300 MG | ORAL | Qty: 1

## 2023-04-26 MED FILL — BUSPIRONE HCL 5 MG PO TABS: 5 MG | ORAL | Qty: 1

## 2023-04-26 MED FILL — LITHIUM CARBONATE 300 MG PO CAPS: 300 MG | ORAL | Qty: 2

## 2023-04-26 MED FILL — GABAPENTIN 300 MG PO CAPS: 300 MG | ORAL | Qty: 1

## 2023-04-26 MED FILL — HYDROXYZINE HCL 50 MG PO TABS: 50 MG | ORAL | Qty: 1

## 2023-04-26 MED FILL — FOLIC ACID 1 MG PO TABS: 1 MG | ORAL | Qty: 1

## 2023-04-26 MED FILL — PALIPERIDONE ER 3 MG PO TB24: 3 MG | ORAL | Qty: 1

## 2023-04-26 MED FILL — XARELTO 10 MG PO TABS: 10 MG | ORAL | Qty: 1

## 2023-04-26 MED FILL — CYTOMEL 5 MCG PO TABS: 5 MCG | ORAL | Qty: 1

## 2023-04-26 MED FILL — TRAZODONE HCL 50 MG PO TABS: 50 MG | ORAL | Qty: 1

## 2023-04-26 MED FILL — LEVOTHYROXINE SODIUM 25 MCG PO TABS: 25 MCG | ORAL | Qty: 1

## 2023-04-26 MED FILL — THIAMINE MONONITRATE 100 MG PO TABS: 100 MG | ORAL | Qty: 1

## 2023-04-26 MED FILL — METHOCARBAMOL 500 MG PO TABS: 500 MG | ORAL | Qty: 2

## 2023-04-26 MED FILL — MONTELUKAST SODIUM 10 MG PO TABS: 10 MG | ORAL | Qty: 1

## 2023-04-26 MED FILL — NALTREXONE HCL 50 MG PO TABS: 50 MG | ORAL | Qty: 1

## 2023-04-26 NOTE — Behavioral Health Treatment Team (Signed)
Patient noted to have slept for approximately 7.5 hours.

## 2023-04-26 NOTE — Progress Notes (Signed)
[facility]  Inpatient Progress Note     Date of Service: 04/26/23  Hospital Day: 6     Subjective/Interval History   04/26/23    Treatment Team Notes:  Notes reviewed and/or discussed and report that Moriya Cimmino is a patient recently admitted to our facility attention invited to the dictated admission note which is self-explanatory.      Patient interview: Kaari Novick was interviewed by this Clinical research associate today.  The patient remains rather withdrawn, describing self as feeling very tired, and indeed sleeping most of the day yesterday.  She is taking combination of multiple medications including but not limited to gabapentin taking and Robaxin for example with the possibility of increased drowsiness specifically in association to the latter considered to be a possible issue.  Will proceed to hold methocarbamol until the patient is evaluated by her attending physician.  We will also recheck the patient's lithium level in addition to ordering a CMP to be drawn tomorrow.  A TSH will also be requested.      Objective     Vitals:    04/26/23 0802   BP: 113/78   Pulse: 80   Resp: 16   Temp: 97.2 F (36.2 C)   SpO2: 95%     Vitals are stable    No results found for this or any previous visit (from the past 24 hour(s)).    Mental Status Examination     Appearance/Hygiene 47 y.o. Unavailable female  Hygiene: Limited   Behavior/Social Relatedness Appropriate   Musculoskeletal Gait/Station: appropriate  Tone (flaccid, cogwheeling, spastic): not assessed  Psychomotor (hyperkinetic, hypokinetic): calm   Involuntary movements (tics, dyskinesias, akathisa, stereotypies): none   Speech   Rate, rhythm, volume, fluency and articulation are appropriate   Mood   Depressed   Affect    Flat   Thought Process Linear and goal directed   Thought Content and Perceptual Disturbances Denies self-injurious behavior (SIB), suicidal ideation (SI), aggressive behavior or homicidal ideation (HI)    Denies auditory and visual  hallucinations, ideas of reference or influence of any delusional thoughts, however he describes depression able to CFS while in the hospital.   Sensorium and Cognition  Grossly intact   Insight  Limited    Judgment Limited        Assessment/Plan      Psychiatric Diagnoses:   Patient Active Problem List   Diagnosis    Moderate mixed bipolar II disorder (HCC)    Alcohol use disorder, severe, dependence (HCC)    Fibromyalgia    Allergic rhinitis    Hypothyroid       Medical Diagnoses: Per attending physician.      1.  Please see orders.  Multiple labs requested.  Treatment with methocarbamol has been held.  Other medications will be the same.  2.  Reviewed instructions, risks, benefits and side effects of medications  3.  Disposition/Discharge Date: self-care/home, TBD    Faustino Congress, MD. [facility]  [facility]  Psychiatry

## 2023-04-26 NOTE — Plan of Care (Signed)
Problem: Self Harm/Suicidality  Goal: Will have no self-injury during hospital stay  Description: INTERVENTIONS:  1.  Ensure constant observer at bedside with Q15M safety checks  2.  Maintain a safe environment  3.  Secure patient belongings  4.  Ensure family/visitors adhere to safety recommendations  5.  Ensure safety tray has been added to patient's diet order  6.  Every shift and PRN: Re-assess suicidal risk via Frequent Screener    04/26/2023 0955 by Tacy Dura, RN  Outcome: Progressing    Problem: Depression  Goal: Will be euthymic at discharge  Description: INTERVENTIONS:  1. Administer medication as ordered  2. Provide emotional support via 1:1 interaction with staff  3. Encourage involvement in milieu/groups/activities  4. Monitor for social isolation  04/26/2023 0955 by Tacy Dura, RN  Outcome: Progressing     Problem: Anxiety  Goal: Will report anxiety at manageable levels  Description: INTERVENTIONS:  1. Administer medication as ordered  2. Teach and rehearse alternative coping skills  3. Provide emotional support with 1:1 interaction with staff  04/26/2023 0955 by Tacy Dura, RN  Outcome: Progressing   Received patient out in dayroom this morning.  Patient states she is still sleepy and will likely return back to bed.  Patient is med/meal compliant.  No behavioral issues noted.

## 2023-04-27 LAB — COMPREHENSIVE METABOLIC PANEL
ALT: 16 U/L (ref 13–56)
AST: 15 U/L (ref 10–38)
Albumin/Globulin Ratio: 0.9 (ref 0.8–1.7)
Albumin: 3 g/dL — ABNORMAL LOW (ref 3.4–5.0)
Alk Phosphatase: 60 U/L (ref 45–117)
Anion Gap: 4 mmol/L (ref 3.0–18)
BUN/Creatinine Ratio: 11 — ABNORMAL LOW (ref 12–20)
BUN: 9 MG/DL (ref 7.0–18)
CO2: 26 mmol/L (ref 21–32)
Calcium: 9 MG/DL (ref 8.5–10.1)
Chloride: 108 mmol/L (ref 100–111)
Creatinine: 0.79 MG/DL (ref 0.6–1.3)
Est, Glom Filt Rate: >90 mL/min/{1.73_m2} (ref >60)
Globulin: 3.2 g/dL (ref 2.0–4.0)
Glucose: 86 mg/dL (ref 74–99)
Potassium: 3.9 mmol/L (ref 3.5–5.5)
Sodium: 138 mmol/L (ref 136–145)
Total Bilirubin: 0.5 MG/DL (ref 0.2–1.0)
Total Protein: 6.2 g/dL — ABNORMAL LOW (ref 6.4–8.2)

## 2023-04-27 LAB — LITHIUM LEVEL: Lithium: 0.9 MMOL/L (ref 0.6–1.2)

## 2023-04-27 LAB — TSH: TSH, 3rd Generation: 0.6 u[IU]/mL (ref 0.36–3.74)

## 2023-04-27 MED ORDER — PALIPERIDONE ER 3 MG PO TB24
3 MG | ORAL_TABLET | Freq: Every evening | ORAL | 0 refills | Status: AC
Start: 2023-04-27 — End: ?

## 2023-04-27 MED ORDER — LIOTHYRONINE SODIUM 5 MCG PO TABS
5 MCG | ORAL_TABLET | Freq: Every day | ORAL | 0 refills | Status: AC
Start: 2023-04-27 — End: ?

## 2023-04-27 MED ORDER — LITHIUM CARBONATE 600 MG PO CAPS
600 MG | ORAL_CAPSULE | Freq: Every evening | ORAL | 0 refills | Status: AC
Start: 2023-04-27 — End: ?

## 2023-04-27 MED ORDER — BUSPIRONE HCL 15 MG PO TABS
15 MG | ORAL_TABLET | Freq: Three times a day (TID) | ORAL | 0 refills | Status: AC
Start: 2023-04-27 — End: ?

## 2023-04-27 MED ORDER — RIVAROXABAN 10 MG PO TABS
10 MG | ORAL_TABLET | Freq: Every day | ORAL | 0 refills | Status: AC
Start: 2023-04-27 — End: ?

## 2023-04-27 MED ORDER — NALTREXONE HCL 50 MG PO TABS
50 MG | ORAL_TABLET | Freq: Every day | ORAL | 0 refills | Status: AC
Start: 2023-04-27 — End: ?

## 2023-04-27 MED ORDER — LITHIUM CARBONATE 300 MG PO CAPS
300 | ORAL_CAPSULE | Freq: Every day | ORAL | 0 refills | Status: AC
Start: 2023-04-27 — End: ?

## 2023-04-27 MED ORDER — GABAPENTIN 300 MG PO CAPS
300 MG | ORAL_CAPSULE | Freq: Three times a day (TID) | ORAL | 0 refills | Status: AC
Start: 2023-04-27 — End: 2023-05-27

## 2023-04-27 MED ORDER — LEVOTHYROXINE SODIUM 175 MCG PO TABS
175 | ORAL_TABLET | Freq: Every day | ORAL | 0 refills | Status: AC
Start: 2023-04-27 — End: ?

## 2023-04-27 MED ORDER — DULOXETINE HCL 60 MG PO CPEP
60 MG | ORAL_CAPSULE | Freq: Two times a day (BID) | ORAL | 0 refills | Status: AC
Start: 2023-04-27 — End: ?

## 2023-04-27 MED ORDER — MULTIPLE VITAMINS PO TABS
Freq: Every day | ORAL | Status: DC
Start: 2023-04-27 — End: 2023-04-27
  Administered 2023-04-27: 15:00:00 1 via ORAL

## 2023-04-27 MED ORDER — MONTELUKAST SODIUM 10 MG PO TABS
10 MG | ORAL_TABLET | Freq: Every evening | ORAL | 0 refills | Status: AC
Start: 2023-04-27 — End: ?

## 2023-04-27 MED ORDER — METHOCARBAMOL 750 MG PO TABS
750 | ORAL_TABLET | Freq: Three times a day (TID) | ORAL | 0 refills | Status: AC
Start: 2023-04-27 — End: ?

## 2023-04-27 MED FILL — BUSPIRONE HCL 5 MG PO TABS: 5 MG | ORAL | Qty: 1

## 2023-04-27 MED FILL — THERA PO TABS: ORAL | Qty: 1

## 2023-04-27 MED FILL — NALTREXONE HCL 50 MG PO TABS: 50 MG | ORAL | Qty: 1

## 2023-04-27 MED FILL — GABAPENTIN 300 MG PO CAPS: 300 MG | ORAL | Qty: 1

## 2023-04-27 MED FILL — THIAMINE MONONITRATE 100 MG PO TABS: 100 MG | ORAL | Qty: 1

## 2023-04-27 MED FILL — LITHIUM CARBONATE 300 MG PO CAPS: 300 MG | ORAL | Qty: 1

## 2023-04-27 MED FILL — CYTOMEL 5 MCG PO TABS: 5 MCG | ORAL | Qty: 1

## 2023-04-27 MED FILL — FOLIC ACID 1 MG PO TABS: 1 MG | ORAL | Qty: 1

## 2023-04-27 MED FILL — METHOCARBAMOL 500 MG PO TABS: 500 MG | ORAL | Qty: 2

## 2023-04-27 MED FILL — LEVOTHYROXINE SODIUM 25 MCG PO TABS: 25 MCG | ORAL | Qty: 1

## 2023-04-27 MED FILL — DULOXETINE HCL 30 MG PO CPEP: 30 MG | ORAL | Qty: 2

## 2023-04-27 NOTE — Plan of Care (Signed)
Problem: Self Harm/Suicidality  Goal: Will have no self-injury during hospital stay  Description: INTERVENTIONS:  1.  Ensure constant observer at bedside with Q15M safety checks  2.  Maintain a safe environment  3.  Secure patient belongings  4.  Ensure family/visitors adhere to safety recommendations  5.  Ensure safety tray has been added to patient's diet order  6.  Every shift and PRN: Re-assess suicidal risk via Frequent Screener    Outcome: Progressing     Problem: Depression  Goal: Will be euthymic at discharge  Description: INTERVENTIONS:  1. Administer medication as ordered  2. Provide emotional support via 1:1 interaction with staff  3. Encourage involvement in milieu/groups/activities  4. Monitor for social isolation  Outcome: Progressing     Problem: Anxiety  Goal: Will report anxiety at manageable levels  Description: INTERVENTIONS:  1. Administer medication as ordered  2. Teach and rehearse alternative coping skills  3. Provide emotional support with 1:1 interaction with staff  Outcome: Not Progressing    Pt is friendly, cooperative but has been slightly agitated this evening due to the behavior of another pt on the unit. Pt is medication compliant and presents with flat facial expressions, constricted affect and a depressed and anxious mood. Pt denies all SI/HI/AVH but reports a feelings of depression (3/10) and anxiety (6/10) caused by her proximity to another patient on the unit who "will not stop talking, and she is so loud its making me crazy." Pt scored a "1" on her CIWA screening this evening. Pt elected to take PRN Atarax 25 mg and Trazodone 50 mg with evening medications. Pt reported pain in her lower back rated 5/10. Upon reassessment after medication administration, pain had decreased to a 2/10, pt was satisfied with pain level. Pt has bmore social this evening, socializing with peers before medication administration. No bx issues observed. Pt is currently in room asleep, will continue to  monitor for safety and comfort.

## 2023-04-27 NOTE — Progress Notes (Signed)
Patient verbalized understanding of discharge papers and medications. Patient ambulatory for discharge. Patient has no questions. Alert and oriented, mood is apathetic, ready for discharge.

## 2023-04-27 NOTE — Group Note (Signed)
Group Therapy Note    Date: 04/27/2023    Group Start Time: 0940  Group End Time: 1030  Group Topic: Recreational    [facility]ULT    Maisie Fus, Tyleah Loh        Group Therapy Note    Attendees: 10/14    Group type: Exercise      Group Focus: This recreational group engaged patients in physical activity.  Recreational therapists lead group members in guided exercises. Patients discussed different physical activities and relaxation techniques they may use or can add to their daily routines. This group may help reduce feelings of depression and stress and enhance mood and over emotional well-being.            Notes:  Patient joined group at a half way point. Patient presented with euthymic. Patient engaged in group through discussion. Patient discussed being worried about her housing and stability.    Status After Intervention:  Unchanged    Participation Level: Sports coach and Interactive    Participation Quality: Appropriate, Attentive, and Sharing      Speech:  normal      Thought Process/Content: Logical      Affective Functioning: Congruent      Mood: euthymic      Level of consciousness:  Alert and Attentive      Response to Learning: Able to verbalize current knowledge/experience      Endings: None Reported    Modes of Intervention: Socialization, Movement, and Media      Recreational Therapist  [employer]

## 2023-04-27 NOTE — Plan of Care (Signed)
Problem: Self Harm/Suicidality  Goal: Will have no self-injury during hospital stay  Description: INTERVENTIONS:  1.  Ensure constant observer at bedside with Q15M safety checks  2.  Maintain a safe environment  3.  Secure patient belongings  4.  Ensure family/visitors adhere to safety recommendations  5.  Ensure safety tray has been added to patient's diet order  6.  Every shift and PRN: Re-assess suicidal risk via Frequent Screener    04/27/2023 0803 by Albertina Senegal, RN  Outcome: Progressing  04/27/2023 0026 by Eden Lathe, RN  Outcome: Progressing     Problem: Depression  Goal: Will be euthymic at discharge  Description: INTERVENTIONS:  1. Administer medication as ordered  2. Provide emotional support via 1:1 interaction with staff  3. Encourage involvement in milieu/groups/activities  4. Monitor for social isolation  04/27/2023 0803 by Albertina Senegal, RN  Outcome: Progressing  04/27/2023 0026 by Eden Lathe, RN  Outcome: Progressing     Problem: Anxiety  Goal: Will report anxiety at manageable levels  Description: INTERVENTIONS:  1. Administer medication as ordered  2. Teach and rehearse alternative coping skills  3. Provide emotional support with 1:1 interaction with staff  04/27/2023 0026 by Eden Lathe, RN  Outcome: Not Progressing     Patient calm and cooperative. Patient denies SI, HI, AVH, pain, depression and anxiety. Patient states she slept well. She describes her mood as "flustered". Patient meal and medication compliant. Patient active in treatment. Patient interacts with peers. Will continue to monitor for ongoing care.

## 2023-04-27 NOTE — Progress Notes (Signed)
SW Contact:      Pt Contact: pt somewhat disappointed no openings at [facility], but willing to start tx ASAP at the [facility] location. We reviewed d/c and Safety Plan, including reminders of working "Recovery Program" when d/c from Facility at [facility].    Pt will go to rehab:    Pt will be entering x30 day tx with:    [facility]  [address] [place].  [address], [place] [address]  # [phone]  [facility] picking pt up 2 pm    Dr & tx team updated.

## 2023-04-27 NOTE — Discharge Instructions (Signed)
BEHAVIORAL HEALTH NURSING DISCHARGE NOTE      The personal items collected during your admission are returned to you.    PATIENT INSTRUCTIONS:    What to do at Home    You may not operate a vehicle for 24-hours.    You may not engage in an occupation involving machinery or appliances.    You may not drink any alcoholic beverages during the next 48-hours.    You may resume normal activities.    Avoid making any critical decisions for at least 24-hours.    Recommended diet: regular diet.    Recommended activity: activity as tolerated.    If you have problems relating to your recovery, call your physician.    The discharge information has been reviewed with the patient.  The patient verbalized understanding.

## 2023-04-27 NOTE — Progress Notes (Signed)
Comprehensive Nutrition Assessment    Type and Reason for Visit:  Initial, RD Nutrition Re-Screen/LOS    Nutrition Recommendations/Plan:   Continue current diet.   Plan to order MVI, continue folic acid and thiamine supplementation 2/2 ETOH use.   Monitor PO intake/tolerance of meals and POC while admitted.      Malnutrition Assessment:  Malnutrition Status:  Insufficient data (04/27/23 1029)    Context:  Acute Illness       Nutrition History and Allergies:   PMHx: ETOH/substance abuse, HLD, hypothroidism, b/l pulmonary embolism. No recent wt hx available. NKFA.     Nutrition Assessment:    Pt admitted for management of moderate mixed bipolar II d/o, ETOH use d/o. Per chart, pt has been meal and medication compliant.     Nutrition Related Findings:    No output data or edema documented. Labs (6/24): reviewed. Pertinent meds: thiamine, folic acid, synthroid. Wound Type: None       Current Nutrition Intake & Therapies:    Average Meal Intake: 51-75%, 76-100%  Average Supplements Intake: None Ordered  ADULT DIET; Regular; Safety Tray; Safety Tray (Disposables)    Anthropometric Measures:  Height: 165.1 cm (5\' 5" )  Ideal Body Weight (IBW): 125 lbs (57 kg)       Current Body Weight: 99.8 kg (220 lb), 176 % IBW. Weight Source: Stated  Current BMI (kg/m2): 36.6  Usual Body Weight:  (no recent wt hx)     Weight Adjustment For: No Adjustment  BMI Categories: Obese Class 2 (BMI 35.0 -39.9)    Estimated Daily Nutrient Needs:  Energy Requirements Based On: Formula  Weight Used for Energy Requirements: Current  Energy (kcal/day): 1645-1810 (MSJ x 1-1.1)  Weight Used for Protein Requirements: Ideal  Protein (g/day): 86-114 (1.5-2)  Method Used for Fluid Requirements: 1 ml/kcal  Fluid (ml/day): 1610-9604    Nutrition Diagnosis:   Increased nutrient needs related to increase demand for energy/nutrients as evidenced by  (ETOH abuse)    Nutrition Interventions:   Food and/or Nutrient Delivery: Continue Current Diet, Vitamin  Supplement, Mineral Supplement     Coordination of Nutrition Care: Continue to monitor while inpatient       Goals:     Goals: Meet at least 75% of estimated needs, by next RD assessment       Nutrition Monitoring and Evaluation:      Food/Nutrient Intake Outcomes: Food and Nutrient Intake, Vitamin/Mineral Intake  Physical Signs/Symptoms Outcomes: Meal Time Behavior    Discharge Planning:    Continue current diet     Barney Drain, RD  Contact: [phone]

## 2023-04-27 NOTE — Behavioral Health Treatment Team (Signed)
Patient appeared to have slept 7 hours.
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
